# Patient Record
Sex: Male | Born: 1950 | Race: White | Hispanic: Yes | Marital: Married | Smoking: Never smoker
Health system: Southern US, Community
[De-identification: ages and names within clinical notes are randomized; demographics above are authoritative.]

## PROBLEM LIST (undated history)

## (undated) DIAGNOSIS — E785 Hyperlipidemia, unspecified: Secondary | ICD-10-CM

## (undated) DIAGNOSIS — Z800 Family history of malignant neoplasm of digestive organs: Secondary | ICD-10-CM

## (undated) DIAGNOSIS — N289 Disorder of kidney and ureter, unspecified: Secondary | ICD-10-CM

## (undated) DIAGNOSIS — Z8049 Family history of malignant neoplasm of other genital organs: Secondary | ICD-10-CM

## (undated) DIAGNOSIS — Z8619 Personal history of other infectious and parasitic diseases: Secondary | ICD-10-CM

## (undated) DIAGNOSIS — R011 Cardiac murmur, unspecified: Secondary | ICD-10-CM

## (undated) DIAGNOSIS — S0501XA Injury of conjunctiva and corneal abrasion without foreign body, right eye, initial encounter: Secondary | ICD-10-CM

## (undated) DIAGNOSIS — G4730 Sleep apnea, unspecified: Secondary | ICD-10-CM

## (undated) DIAGNOSIS — G4733 Obstructive sleep apnea (adult) (pediatric): Secondary | ICD-10-CM

## (undated) DIAGNOSIS — M1990 Unspecified osteoarthritis, unspecified site: Secondary | ICD-10-CM

## (undated) DIAGNOSIS — H269 Unspecified cataract: Secondary | ICD-10-CM

## (undated) DIAGNOSIS — Z8042 Family history of malignant neoplasm of prostate: Secondary | ICD-10-CM

## (undated) DIAGNOSIS — R7303 Prediabetes: Secondary | ICD-10-CM

## (undated) DIAGNOSIS — S0502XA Injury of conjunctiva and corneal abrasion without foreign body, left eye, initial encounter: Secondary | ICD-10-CM

## (undated) HISTORY — PX: TONSILLECTOMY: SUR1361

## (undated) HISTORY — DX: Family history of malignant neoplasm of digestive organs: Z80.0

## (undated) HISTORY — DX: Disorder of kidney and ureter, unspecified: N28.9

## (undated) HISTORY — PX: WISDOM TOOTH EXTRACTION: SHX21

## (undated) HISTORY — DX: Obstructive sleep apnea (adult) (pediatric): G47.33

## (undated) HISTORY — DX: Family history of malignant neoplasm of other genital organs: Z80.49

## (undated) HISTORY — DX: Unspecified cataract: H26.9

## (undated) HISTORY — DX: Personal history of other infectious and parasitic diseases: Z86.19

## (undated) HISTORY — DX: Family history of malignant neoplasm of prostate: Z80.42

## (undated) HISTORY — DX: Prediabetes: R73.03

## (undated) HISTORY — PX: HERNIA REPAIR: SHX51

---

## 2008-12-07 HISTORY — PX: EYE SURGERY: SHX253

## 2021-01-15 ENCOUNTER — Ambulatory Visit (INDEPENDENT_AMBULATORY_CARE_PROVIDER_SITE_OTHER): Payer: Medicare Other | Admitting: Orthopaedic Surgery

## 2021-01-15 ENCOUNTER — Ambulatory Visit: Payer: Self-pay

## 2021-01-15 VITALS — Ht 73.5 in | Wt 220.0 lb

## 2021-01-15 DIAGNOSIS — M25552 Pain in left hip: Secondary | ICD-10-CM | POA: Diagnosis not present

## 2021-01-15 DIAGNOSIS — M1612 Unilateral primary osteoarthritis, left hip: Secondary | ICD-10-CM | POA: Diagnosis not present

## 2021-01-15 NOTE — Progress Notes (Signed)
Office Visit Note   Patient: Joel Brown           Date of Birth: February 27, 1951           MRN: [id number] Visit Date: 01/15/2021              Requested by: No referring provider defined for this encounter. PCP: No primary care provider on file.   Assessment & Plan: Visit Diagnoses:  1. Pain in left hip   2. Unilateral primary osteoarthritis, left hip     Plan: I agree with the other orthopedic surgeon in town who is my colleagues that he would benefit from a hip replacement for his left hip to treat the pain from his osteoarthritis. I had a long and thorough discussion with him about his hip. I showed him a hip model and explained in detail what this type of surgery involves. We discussed the risk and benefits of surgery as well as what to expect from an intraoperative and postoperative course. He would actually like me to schedule this for him and I agree to do this. We will work on getting it scheduled in the near future. All questions and concerns were answered and addressed. We will be in touch with him. He is welcome to reach out for other questions if he has them.  Follow-Up Instructions: Return for 2 weeks post-op.   Orders:  No orders of the defined types were placed in this encounter.  No orders of the defined types were placed in this encounter.     Procedures: No procedures performed   Clinical Data: No additional findings.   Subjective: Chief Complaint  Patient presents with  . Left Hip - Pain  The patient is a very pleasant and active 70 year old gentleman who comes in for a second opinion as it relates to his left hip. He does have well-documented osteoarthritis of his left hip. It is becoming worse to the point where it is detrimentally affecting his mobility, his quality of life and his activities day living. He has a much harder time putting his shoes and socks on on the left side than the right side. This started around 2020 so has been hurting him for  getting close to 2 years now. He has been told in the past that he has chronic renal insufficiency. He otherwise has some cholesterol issues but no significant medical problems. He has moved here from [place]. He does have an upcoming appointment in early March with a new primary care physician for him. He is seeing one of my colleagues in town who appropriately recommended a hip replacement. That surgeon is accomplished surgeon and does perform anterior hip surgery. He came to me for second opinion because he has had some people recommend seeing me as well. At this point his left hip pain occurs quite significantly on a daily basis.  HPI  Review of Systems There is currently no listed headache, chest pain, shortness of breath, fever, chills, nausea, vomiting  Objective: Vital Signs: Ht 6' 1.5" (1.867 m)   Wt 220 lb (99.8 kg)   BMI 28.63 kg/m   Physical Exam He is alert and oriented x3 and in no acute distress Ortho Exam Examination of his right hip is normal with full range of motion and no pain in the groin. Examination of his left hip shows limitations with internal and external rotation and significant pain in the groin. Specialty Comments:  No specialty comments available.  Imaging: No results found. X-rays  that accompany him that are independent reviewed of his pelvis and left hip show significant end-stage arthritis of the left hip. There is complete loss of the superior lateral joint space as well as some flattening of the femoral head and essentially bone-on-bone wear. There are sclerotic changes in the femoral head and acetabulum with a small cyst in the acetabulum which is consistent with his osteoarthritis.  PMFS History: Patient Active Problem List   Diagnosis Date Noted  . Unilateral primary osteoarthritis, left hip 01/15/2021   No past medical history on file.  No family history on file.   Social History   Occupational History  . Not on file  Tobacco Use  .  Smoking status: Not on file  . Smokeless tobacco: Not on file  Substance and Sexual Activity  . Alcohol use: Not on file  . Drug use: Not on file  . Sexual activity: Not on file

## 2021-02-12 ENCOUNTER — Other Ambulatory Visit: Payer: Self-pay | Admitting: Internal Medicine

## 2021-02-12 DIAGNOSIS — Z01818 Encounter for other preprocedural examination: Secondary | ICD-10-CM

## 2021-02-12 DIAGNOSIS — E785 Hyperlipidemia, unspecified: Secondary | ICD-10-CM

## 2021-02-12 DIAGNOSIS — Z8249 Family history of ischemic heart disease and other diseases of the circulatory system: Secondary | ICD-10-CM

## 2021-02-12 DIAGNOSIS — R030 Elevated blood-pressure reading, without diagnosis of hypertension: Secondary | ICD-10-CM

## 2021-02-12 DIAGNOSIS — I10 Essential (primary) hypertension: Secondary | ICD-10-CM

## 2021-02-13 ENCOUNTER — Other Ambulatory Visit: Payer: Self-pay

## 2021-02-13 ENCOUNTER — Ambulatory Visit: Payer: Medicare Other

## 2021-02-13 DIAGNOSIS — Z8249 Family history of ischemic heart disease and other diseases of the circulatory system: Secondary | ICD-10-CM

## 2021-02-13 DIAGNOSIS — Z01818 Encounter for other preprocedural examination: Secondary | ICD-10-CM

## 2021-02-13 DIAGNOSIS — I10 Essential (primary) hypertension: Secondary | ICD-10-CM

## 2021-02-14 ENCOUNTER — Other Ambulatory Visit: Payer: Self-pay

## 2021-03-04 ENCOUNTER — Other Ambulatory Visit: Payer: Self-pay | Admitting: Physician Assistant

## 2021-03-05 NOTE — Patient Instructions (Addendum)
DUE TO COVID-19 ONLY ONE VISITOR IS ALLOWED TO COME WITH YOU AND STAY IN THE WAITING ROOM ONLY DURING PRE OP AND PROCEDURE DAY OF SURGERY. THE 1 VISITOR  MAY VISIT WITH YOU AFTER SURGERY IN YOUR PRIVATE ROOM DURING VISITING HOURS ONLY!  YOU NEED TO HAVE A COVID 19 TEST ON_4/6______ @__2 :30pm_____, THIS TEST MUST BE DONE BEFORE SURGERY,  COVID TESTING SITE [address] [place] , IT IS ON THE RIGHT GOING OUT [address] APPROXIMATELY  2 MINUTES PAST [employer] ON THE RIGHT. ONCE YOUR COVID TEST IS COMPLETED,  PLEASE BEGIN THE QUARANTINE INSTRUCTIONS AS OUTLINED IN YOUR HANDOUT.                Seraphim Trow    Your procedure is scheduled on: 03/14/21   Report to [facility] Main  Entrance   Report to admitting at  9:30 AM     Call this number if you have problems the morning of surgery [phone]    BRUSH YOUR TEETH MORNING OF SURGERY AND RINSE YOUR MOUTH OUT, NO CHEWING GUM CANDY OR MINTS.    No food after midnight.    You may have clear liquid until 9:00 AM.    At 8:30 AM drink pre surgery drink.   Nothing by mouth after 9:00  AM.    Take these medicines the morning of surgery with A SIP OF WATER: none                                You may not have any metal on your body including              piercings  Do not wear jewelry,  lotions, powders or deodorant                         Men may shave face and neck.   Do not bring valuables to the hospital. [facility] IS NOT             RESPONSIBLE   FOR VALUABLES.  Contacts, dentures or bridgework may not be worn into surgery.    _____________________________________________________________________             [facility] - Preparing for Surgery Before surgery, you can play an important role.  Because skin is not sterile, your skin needs to be as free of germs as possible.  You can reduce the number of germs on your skin by washing with CHG (chlorahexidine gluconate) soap before  surgery.  CHG is an antiseptic cleaner which kills germs and bonds with the skin to continue killing germs even after washing. Please DO NOT use if you have an allergy to CHG or antibacterial soaps.  If your skin becomes reddened/irritated stop using the CHG and inform your nurse when you arrive at Short Stay..  You may shave your face/neck.  Please follow these instructions carefully:  1.  Shower with CHG Soap the night before surgery and the  morning of Surgery.  2.  If you choose to wash your hair, wash your hair first as usual with your  normal  shampoo.  3.  After you shampoo, rinse your hair and body thoroughly to remove the  shampoo.  4.  Use CHG as you would any other liquid soap.  You can apply chg directly  to the skin and wash                       Gently with a scrungie or clean washcloth.  5.  Apply the CHG Soap to your body ONLY FROM THE NECK DOWN.   Do not use on face/ open                           Wound or open sores. Avoid contact with eyes, ears mouth and genitals (private parts).                       Wash face,  Genitals (private parts) with your normal soap.             6.  Wash thoroughly, paying special attention to the area where your surgery  will be performed.  7.  Thoroughly rinse your body with warm water from the neck down.  8.  DO NOT shower/wash with your normal soap after using and rinsing off  the CHG Soap.             9.  Pat yourself dry with a clean towel.            10.  Wear clean pajamas.            11.  Place clean sheets on your bed the night of your first shower and do not  sleep with pets. Day of Surgery : Do not apply any lotions/deodorants the morning of surgery.  Please wear clean clothes to the hospital/surgery center.  FAILURE TO FOLLOW THESE INSTRUCTIONS MAY RESULT IN THE CANCELLATION OF YOUR SURGERY PATIENT SIGNATURE_________________________________  NURSE  SIGNATURE__________________________________  ________________________________________________________________________   Adam Phenix  An incentive spirometer is a tool that can help keep your lungs clear and active. This tool measures how well you are filling your lungs with each breath. Taking long deep breaths may help reverse or decrease the chance of developing breathing (pulmonary) problems (especially infection) following:  A long period of time when you are unable to move or be active. BEFORE THE PROCEDURE   If the spirometer includes an indicator to show your best effort, your nurse or respiratory therapist will set it to a desired goal.  If possible, sit up straight or lean slightly forward. Try not to slouch.  Hold the incentive spirometer in an upright position. INSTRUCTIONS FOR USE  1. Sit on the edge of your bed if possible, or sit up as far as you can in bed or on a chair. 2. Hold the incentive spirometer in an upright position. 3. Breathe out normally. 4. Place the mouthpiece in your mouth and seal your lips tightly around it. 5. Breathe in slowly and as deeply as possible, raising the piston or the ball toward the top of the column. 6. Hold your breath for 3-5 seconds or for as long as possible. Allow the piston or ball to fall to the bottom of the column. 7. Remove the mouthpiece from your mouth and breathe out normally. 8. Rest for a few seconds and repeat Steps 1 through 7 at least 10 times every 1-2 hours when you are awake. Take your time and take a few normal breaths between deep breaths. 9. The spirometer may include an indicator to show your best effort.  Use the indicator as a goal to work toward during each repetition. 10. After each set of 10 deep breaths, practice coughing to be sure your lungs are clear. If you have an incision (the cut made at the time of surgery), support your incision when coughing by placing a pillow or rolled up towels firmly  against it. Once you are able to get out of bed, walk around indoors and cough well. You may stop using the incentive spirometer when instructed by your caregiver.  RISKS AND COMPLICATIONS  Take your time so you do not get dizzy or light-headed.  If you are in pain, you may need to take or ask for pain medication before doing incentive spirometry. It is harder to take a deep breath if you are having pain. AFTER USE  Rest and breathe slowly and easily.  It can be helpful to keep track of a log of your progress. Your caregiver can provide you with a simple table to help with this. If you are using the spirometer at home, follow these instructions: [place] IF:   You are having difficultly using the spirometer.  You have trouble using the spirometer as often as instructed.  Your pain medication is not giving enough relief while using the spirometer.  You develop fever of 100.5 F (38.1 C) or higher. SEEK IMMEDIATE MEDICAL CARE IF:   You cough up bloody sputum that had not been present before.  You develop fever of 102 F (38.9 C) or greater.  You develop worsening pain at or near the incision site. MAKE SURE YOU:   Understand these instructions.  Will watch your condition.  Will get help right away if you are not doing well or get worse. Document Released: 04/05/2007 Document Revised: 02/15/2012 Document Reviewed: 06/06/2007 [facility] Patient Information 2014 [place], [place].   ________________________________________________________________________

## 2021-03-06 ENCOUNTER — Other Ambulatory Visit: Payer: Self-pay

## 2021-03-06 ENCOUNTER — Encounter (HOSPITAL_COMMUNITY)
Admission: RE | Admit: 2021-03-06 | Discharge: 2021-03-06 | Disposition: A | Discharge status: Home / Self Care | Payer: Medicare Other | Source: Ambulatory Visit | Attending: Orthopaedic Surgery | Admitting: Orthopaedic Surgery

## 2021-03-06 ENCOUNTER — Encounter (HOSPITAL_COMMUNITY): Payer: Self-pay

## 2021-03-06 DIAGNOSIS — Z01818 Encounter for other preprocedural examination: Secondary | ICD-10-CM | POA: Diagnosis not present

## 2021-03-06 HISTORY — DX: Hyperlipidemia, unspecified: E78.5

## 2021-03-06 HISTORY — DX: Injury of conjunctiva and corneal abrasion without foreign body, right eye, initial encounter: S05.01XA

## 2021-03-06 HISTORY — DX: Unspecified osteoarthritis, unspecified site: M19.90

## 2021-03-06 HISTORY — DX: Cardiac murmur, unspecified: R01.1

## 2021-03-06 HISTORY — DX: Sleep apnea, unspecified: G47.30

## 2021-03-06 HISTORY — DX: Injury of conjunctiva and corneal abrasion without foreign body, left eye, initial encounter: S05.02XA

## 2021-03-06 LAB — CBC
HCT: 45.4 % (ref 39.0–52.0)
Hemoglobin: 15.4 g/dL (ref 13.0–17.0)
MCH: 31.4 pg (ref 26.0–34.0)
MCHC: 33.9 g/dL (ref 30.0–36.0)
MCV: 92.7 fL (ref 80.0–100.0)
Platelets: 224 10*3/uL (ref 150–400)
RBC: 4.9 MIL/uL (ref 4.22–5.81)
RDW: 13.2 % (ref 11.5–15.5)
WBC: 8.7 10*3/uL (ref 4.0–10.5)
nRBC: 0 % (ref 0.0–0.2)

## 2021-03-06 LAB — SURGICAL PCR SCREEN
MRSA, PCR: NEGATIVE
Staphylococcus aureus: POSITIVE — AB

## 2021-03-06 NOTE — Progress Notes (Signed)
COVID Vaccine Completed:yes Date COVID Vaccine completed:02/23/20-booster 10/09/20 COVID vaccine manufacturer:   Moderna     PCP - Charlane Ferretti DO Cardiologist -   Chest x-ray - no EKG - 03/06/21-chart Stress Test - no ECHO - 02/13/21-epic Cardiac Cath - no Pacemaker/ICD device last checked:NA  Sleep Study - yes CPAP - yes  Fasting Blood Sugar - NA Checks Blood Sugar _____ times a day  Blood Thinner Instructions:NA Aspirin Instructions: Last Dose:  Anesthesia review:   Patient denies shortness of breath, fever, cough and chest pain at PAT appointment yes Patient verbalized understanding of instructions that were given to them at the PAT appointment. Patient was also instructed that they will need to review over the PAT instructions again at home before surgery.yes Pt has no SOB with activities.

## 2021-03-12 ENCOUNTER — Other Ambulatory Visit (HOSPITAL_COMMUNITY): Payer: Medicare Other

## 2021-03-13 ENCOUNTER — Encounter (HOSPITAL_COMMUNITY): Payer: Self-pay | Admitting: Orthopaedic Surgery

## 2021-03-13 ENCOUNTER — Other Ambulatory Visit (HOSPITAL_COMMUNITY)
Admission: RE | Admit: 2021-03-13 | Discharge: 2021-03-13 | Disposition: A | Discharge status: Home / Self Care | Payer: Medicare Other | Source: Ambulatory Visit | Attending: Orthopaedic Surgery | Admitting: Orthopaedic Surgery

## 2021-03-13 DIAGNOSIS — Z01812 Encounter for preprocedural laboratory examination: Secondary | ICD-10-CM | POA: Diagnosis present

## 2021-03-13 DIAGNOSIS — Z20822 Contact with and (suspected) exposure to covid-19: Secondary | ICD-10-CM | POA: Diagnosis not present

## 2021-03-13 LAB — SARS CORONAVIRUS 2 (TAT 6-24 HRS): SARS Coronavirus 2: NEGATIVE

## 2021-03-13 NOTE — H&P (Signed)
TOTAL HIP ADMISSION H&P  Patient is admitted for left total hip arthroplasty.  Subjective:  Chief Complaint: left hip pain  HPI: Joel Brown, 70 y.o. male, has a history of pain and functional disability in the left hip(s) due to arthritis and patient has failed non-surgical conservative treatments for greater than 12 weeks to include NSAID's and/or analgesics and activity modification.  Onset of symptoms was gradual starting 2 years ago with gradually worsening course since that time.The patient noted no past surgery on the left hip(s).  Patient currently rates pain in the left hip at 8 out of 10 with activity. Patient has night pain, worsening of pain with activity and weight bearing, trendelenberg gait, pain that interfers with activities of daily living and pain with passive range of motion. Patient has evidence of subchondral sclerosis, periarticular osteophytes and joint space narrowing by imaging studies. This condition presents safety issues increasing the risk of falls.  There is no current active infection.  Patient Active Problem List   Diagnosis Date Noted  . Unilateral primary osteoarthritis, left hip 01/15/2021   Past Medical History:  Diagnosis Date  . Arthritis    fingers, hip  . Bilateral corneal abrasions    uses an ointment daily  . Heart murmur   . Hyperlipidemia   . Sleep apnea     Past Surgical History:  Procedure Laterality Date  . EYE SURGERY Bilateral 2010  . HERNIA REPAIR    . TONSILLECTOMY     as a child  . WISDOM TOOTH EXTRACTION      No current facility-administered medications for this encounter.   Current Outpatient Medications  Medication Sig Dispense Refill Last Dose  . atorvastatin (LIPITOR) 10 MG tablet Take 10 mg by mouth daily.     . Glucosamine HCl 1500 MG TABS Take 3,000 mg by mouth daily. Dona     . Multiple Vitamins-Minerals (MULTIVITAMIN WITH MINERALS) tablet Take 6 tablets by mouth daily. Package with a total of 6 pill   Omega  3 Co q 10/ phyto Probiotic     . sodium chloride (MURO 128) 5 % ophthalmic ointment Place 1 application into both eyes at bedtime.     . Sodium Hyaluronate, oral, (HYALURONIC ACID PO) Take 3 capsules by mouth daily. With supplement      Allergies  Allergen Reactions  . Iodine Itching    IV IODINE   . Penicillins Other (See Comments)    unknown  . Latex Rash    Social History   Tobacco Use  . Smoking status: Never Smoker  . Smokeless tobacco: Never Used  Substance Use Topics  . Alcohol use: Yes    Comment: 1/month    History reviewed. No pertinent family history.   Review of Systems  All other systems reviewed and are negative.   Objective:  Physical Exam Vitals reviewed.  Constitutional:      Appearance: Normal appearance.  HENT:     Head: Normocephalic and atraumatic.  Eyes:     Extraocular Movements: Extraocular movements intact.     Pupils: Pupils are equal, round, and reactive to light.  Cardiovascular:     Rate and Rhythm: Normal rate.     Pulses: Normal pulses.  Pulmonary:     Effort: Pulmonary effort is normal.  Abdominal:     Palpations: Abdomen is soft.  Musculoskeletal:     Cervical back: Normal range of motion.     Right hip: Tenderness and bony tenderness present. Decreased range of motion. Decreased  strength.  Neurological:     Mental Status: He is alert and oriented to person, place, and time.  Psychiatric:        Behavior: Behavior normal.     Vital signs in last 24 hours:    Labs:   Estimated body mass index is 28.63 kg/m as calculated from the following:   Height as of 03/06/21: 6\' 1"  (1.854 m).   Weight as of 03/06/21: 98.4 kg.   Imaging Review Plain radiographs demonstrate severe degenerative joint disease of the left hip(s). The bone quality appears to be good for age and reported activity level.      Assessment/Plan:  End stage arthritis, left hip(s)  The patient history, physical examination, clinical judgement of the  provider and imaging studies are consistent with end stage degenerative joint disease of the left hip(s) and total hip arthroplasty is deemed medically necessary. The treatment options including medical management, injection therapy, arthroscopy and arthroplasty were discussed at length. The risks and benefits of total hip arthroplasty were presented and reviewed. The risks due to aseptic loosening, infection, stiffness, dislocation/subluxation,  thromboembolic complications and other imponderables were discussed.  The patient acknowledged the explanation, agreed to proceed with the plan and consent was signed. Patient is being admitted for inpatient treatment for surgery, pain control, PT, OT, prophylactic antibiotics, VTE prophylaxis, progressive ambulation and ADL's and discharge planning.The patient is planning to be discharged home with home health services

## 2021-03-14 ENCOUNTER — Ambulatory Visit (HOSPITAL_COMMUNITY): Payer: Medicare Other

## 2021-03-14 ENCOUNTER — Other Ambulatory Visit: Payer: Self-pay

## 2021-03-14 ENCOUNTER — Ambulatory Visit (HOSPITAL_COMMUNITY): Payer: Medicare Other | Admitting: Anesthesiology

## 2021-03-14 ENCOUNTER — Encounter (HOSPITAL_COMMUNITY): Payer: Self-pay | Admitting: Orthopaedic Surgery

## 2021-03-14 ENCOUNTER — Encounter (HOSPITAL_COMMUNITY)
Admission: RE | Disposition: A | Discharge status: Home / Self Care | Payer: Self-pay | Source: Ambulatory Visit | Attending: Orthopaedic Surgery

## 2021-03-14 ENCOUNTER — Observation Stay (HOSPITAL_COMMUNITY)
Admission: RE | Admit: 2021-03-14 | Discharge: 2021-03-15 | Disposition: A | Discharge status: Home / Self Care | Payer: Medicare Other | Source: Ambulatory Visit | Attending: Orthopaedic Surgery | Admitting: Orthopaedic Surgery

## 2021-03-14 DIAGNOSIS — Z96642 Presence of left artificial hip joint: Secondary | ICD-10-CM

## 2021-03-14 DIAGNOSIS — Z91040 Latex allergy status: Secondary | ICD-10-CM | POA: Insufficient documentation

## 2021-03-14 DIAGNOSIS — M1612 Unilateral primary osteoarthritis, left hip: Secondary | ICD-10-CM | POA: Diagnosis present

## 2021-03-14 DIAGNOSIS — Z419 Encounter for procedure for purposes other than remedying health state, unspecified: Secondary | ICD-10-CM

## 2021-03-14 DIAGNOSIS — Z79899 Other long term (current) drug therapy: Secondary | ICD-10-CM | POA: Insufficient documentation

## 2021-03-14 DIAGNOSIS — M24852 Other specific joint derangements of left hip, not elsewhere classified: Secondary | ICD-10-CM | POA: Diagnosis not present

## 2021-03-14 HISTORY — PX: TOTAL HIP ARTHROPLASTY: SHX124

## 2021-03-14 LAB — TYPE AND SCREEN
ABO/RH(D): O POS
Antibody Screen: NEGATIVE

## 2021-03-14 LAB — ABO/RH: ABO/RH(D): O POS

## 2021-03-14 SURGERY — ARTHROPLASTY, HIP, TOTAL, ANTERIOR APPROACH
Anesthesia: Monitor Anesthesia Care | Site: Hip | Laterality: Left

## 2021-03-14 MED ORDER — CHLORHEXIDINE GLUCONATE 0.12 % MT SOLN
15.0000 mL | Freq: Once | OROMUCOSAL | Status: AC
  Administered 2021-03-14: 15 mL via OROMUCOSAL

## 2021-03-14 MED ORDER — HYDROMORPHONE HCL 1 MG/ML IJ SOLN: 0.5000 mg | INTRAMUSCULAR | Status: DC | PRN

## 2021-03-14 MED ORDER — ONDANSETRON HCL 4 MG PO TABS: 4.0000 mg | ORAL_TABLET | Freq: Four times a day (QID) | ORAL | Status: DC | PRN

## 2021-03-14 MED ORDER — METOCLOPRAMIDE HCL 5 MG PO TABS: 5.0000 mg | ORAL_TABLET | Freq: Three times a day (TID) | ORAL | Status: DC | PRN

## 2021-03-14 MED ORDER — METHOCARBAMOL 500 MG PO TABS
500.0000 mg | ORAL_TABLET | Freq: Four times a day (QID) | ORAL | Status: DC | PRN
  Administered 2021-03-15: 500 mg via ORAL
  Filled 2021-03-14: qty 1

## 2021-03-14 MED ORDER — OXYCODONE HCL 5 MG PO TABS: 5.0000 mg | ORAL_TABLET | Freq: Once | ORAL | Status: DC | PRN

## 2021-03-14 MED ORDER — METHOCARBAMOL 500 MG IVPB - SIMPLE MED
500.0000 mg | Freq: Four times a day (QID) | INTRAVENOUS | Status: DC | PRN
  Filled 2021-03-14: qty 50

## 2021-03-14 MED ORDER — ONDANSETRON HCL 4 MG/2ML IJ SOLN: 4.0000 mg | Freq: Once | INTRAMUSCULAR | Status: DC | PRN

## 2021-03-14 MED ORDER — LIDOCAINE 2% (20 MG/ML) 5 ML SYRINGE
INTRAMUSCULAR | Status: AC
  Filled 2021-03-14: qty 5

## 2021-03-14 MED ORDER — MIDAZOLAM HCL 2 MG/2ML IJ SOLN
INTRAMUSCULAR | Status: AC
  Filled 2021-03-14: qty 2

## 2021-03-14 MED ORDER — PANTOPRAZOLE SODIUM 40 MG PO TBEC
40.0000 mg | DELAYED_RELEASE_TABLET | Freq: Every day | ORAL | Status: DC
  Administered 2021-03-14 – 2021-03-15 (×2): 40 mg via ORAL
  Filled 2021-03-14 (×2): qty 1

## 2021-03-14 MED ORDER — ONDANSETRON HCL 4 MG/2ML IJ SOLN
INTRAMUSCULAR | Status: DC | PRN
  Administered 2021-03-14: 4 mg via INTRAVENOUS

## 2021-03-14 MED ORDER — METOCLOPRAMIDE HCL 5 MG/ML IJ SOLN: 5.0000 mg | Freq: Three times a day (TID) | INTRAMUSCULAR | Status: DC | PRN

## 2021-03-14 MED ORDER — MENTHOL 3 MG MT LOZG: 1.0000 | LOZENGE | OROMUCOSAL | Status: DC | PRN

## 2021-03-14 MED ORDER — PROPOFOL 1000 MG/100ML IV EMUL
INTRAVENOUS | Status: AC
  Filled 2021-03-14: qty 100

## 2021-03-14 MED ORDER — PHENYLEPHRINE HCL-NACL 10-0.9 MG/250ML-% IV SOLN
INTRAVENOUS | Status: DC | PRN
  Administered 2021-03-14: 30 ug/min via INTRAVENOUS

## 2021-03-14 MED ORDER — MUPIROCIN 2 % EX OINT
1.0000 "application " | TOPICAL_OINTMENT | Freq: Two times a day (BID) | CUTANEOUS | Status: DC
  Administered 2021-03-14: 1 via TOPICAL
  Filled 2021-03-14: qty 22

## 2021-03-14 MED ORDER — TRANEXAMIC ACID-NACL 1000-0.7 MG/100ML-% IV SOLN
1000.0000 mg | INTRAVENOUS | Status: AC
  Administered 2021-03-14: 1000 mg via INTRAVENOUS
  Filled 2021-03-14: qty 100

## 2021-03-14 MED ORDER — CLINDAMYCIN PHOSPHATE 600 MG/50ML IV SOLN
600.0000 mg | Freq: Four times a day (QID) | INTRAVENOUS | Status: AC
  Administered 2021-03-14 – 2021-03-15 (×2): 600 mg via INTRAVENOUS
  Filled 2021-03-14 (×2): qty 50

## 2021-03-14 MED ORDER — ALUM & MAG HYDROXIDE-SIMETH 200-200-20 MG/5ML PO SUSP: 30.0000 mL | ORAL | Status: DC | PRN

## 2021-03-14 MED ORDER — GLYCOPYRROLATE PF 0.2 MG/ML IJ SOSY
PREFILLED_SYRINGE | INTRAMUSCULAR | Status: AC
  Filled 2021-03-14: qty 1

## 2021-03-14 MED ORDER — FENTANYL CITRATE (PF) 100 MCG/2ML IJ SOLN: 25.0000 ug | INTRAMUSCULAR | Status: DC | PRN

## 2021-03-14 MED ORDER — SODIUM CHLORIDE 0.9 % IV SOLN: INTRAVENOUS | Status: DC

## 2021-03-14 MED ORDER — FENTANYL CITRATE (PF) 100 MCG/2ML IJ SOLN
INTRAMUSCULAR | Status: DC | PRN
  Administered 2021-03-14 (×2): 50 ug via INTRAVENOUS

## 2021-03-14 MED ORDER — OXYCODONE HCL 5 MG PO TABS
5.0000 mg | ORAL_TABLET | ORAL | Status: DC | PRN
  Administered 2021-03-14: 10 mg via ORAL
  Administered 2021-03-15: 5 mg via ORAL
  Administered 2021-03-15: 10 mg via ORAL
  Filled 2021-03-14: qty 2
  Filled 2021-03-14: qty 1

## 2021-03-14 MED ORDER — 0.9 % SODIUM CHLORIDE (POUR BTL) OPTIME
TOPICAL | Status: DC | PRN
  Administered 2021-03-14: 1000 mL

## 2021-03-14 MED ORDER — LIDOCAINE HCL (CARDIAC) PF 100 MG/5ML IV SOSY
PREFILLED_SYRINGE | INTRAVENOUS | Status: DC | PRN
  Administered 2021-03-14: 50 mg via INTRAVENOUS

## 2021-03-14 MED ORDER — DIPHENHYDRAMINE HCL 12.5 MG/5ML PO ELIX
12.5000 mg | ORAL_SOLUTION | ORAL | Status: DC | PRN
  Administered 2021-03-15: 25 mg via ORAL
  Filled 2021-03-14: qty 10

## 2021-03-14 MED ORDER — SODIUM CHLORIDE 0.9 % IR SOLN
Status: DC | PRN
  Administered 2021-03-14: 1000 mL

## 2021-03-14 MED ORDER — ONDANSETRON HCL 4 MG/2ML IJ SOLN: 4.0000 mg | Freq: Four times a day (QID) | INTRAMUSCULAR | Status: DC | PRN

## 2021-03-14 MED ORDER — DOCUSATE SODIUM 100 MG PO CAPS
100.0000 mg | ORAL_CAPSULE | Freq: Two times a day (BID) | ORAL | Status: DC
  Administered 2021-03-14 – 2021-03-15 (×2): 100 mg via ORAL
  Filled 2021-03-14 (×2): qty 1

## 2021-03-14 MED ORDER — ACETAMINOPHEN 325 MG PO TABS
325.0000 mg | ORAL_TABLET | Freq: Four times a day (QID) | ORAL | Status: DC | PRN
  Administered 2021-03-15: 650 mg via ORAL
  Filled 2021-03-14: qty 2

## 2021-03-14 MED ORDER — FENTANYL CITRATE (PF) 100 MCG/2ML IJ SOLN
INTRAMUSCULAR | Status: AC
  Filled 2021-03-14: qty 2

## 2021-03-14 MED ORDER — LACTATED RINGERS IV SOLN: INTRAVENOUS | Status: DC

## 2021-03-14 MED ORDER — PROPOFOL 500 MG/50ML IV EMUL
INTRAVENOUS | Status: DC | PRN
  Administered 2021-03-14: 50 ug/kg/min via INTRAVENOUS

## 2021-03-14 MED ORDER — ORAL CARE MOUTH RINSE: 15.0000 mL | Freq: Once | OROMUCOSAL | Status: AC

## 2021-03-14 MED ORDER — ASPIRIN 81 MG PO CHEW
81.0000 mg | CHEWABLE_TABLET | Freq: Two times a day (BID) | ORAL | Status: DC
  Administered 2021-03-14 – 2021-03-15 (×2): 81 mg via ORAL
  Filled 2021-03-14 (×2): qty 1

## 2021-03-14 MED ORDER — CLINDAMYCIN PHOSPHATE 900 MG/50ML IV SOLN
900.0000 mg | INTRAVENOUS | Status: AC
  Administered 2021-03-14: 900 mg via INTRAVENOUS
  Filled 2021-03-14: qty 50

## 2021-03-14 MED ORDER — DEXAMETHASONE SODIUM PHOSPHATE 10 MG/ML IJ SOLN
INTRAMUSCULAR | Status: AC
  Filled 2021-03-14: qty 1

## 2021-03-14 MED ORDER — ATORVASTATIN CALCIUM 10 MG PO TABS
10.0000 mg | ORAL_TABLET | Freq: Every day | ORAL | Status: DC
  Administered 2021-03-14 – 2021-03-15 (×2): 10 mg via ORAL
  Filled 2021-03-14 (×2): qty 1

## 2021-03-14 MED ORDER — OXYCODONE HCL 5 MG/5ML PO SOLN: 5.0000 mg | Freq: Once | ORAL | Status: DC | PRN

## 2021-03-14 MED ORDER — CHLORHEXIDINE GLUCONATE CLOTH 2 % EX PADS: 6.0000 | MEDICATED_PAD | Freq: Every day | CUTANEOUS | Status: DC

## 2021-03-14 MED ORDER — OXYCODONE HCL 5 MG PO TABS
10.0000 mg | ORAL_TABLET | ORAL | Status: DC | PRN
Start: 2021-03-14 — End: 2021-03-15
  Filled 2021-03-14: qty 2

## 2021-03-14 MED ORDER — DEXAMETHASONE SODIUM PHOSPHATE 10 MG/ML IJ SOLN
INTRAMUSCULAR | Status: DC | PRN
  Administered 2021-03-14: 8 mg via INTRAVENOUS

## 2021-03-14 MED ORDER — MIDAZOLAM HCL 5 MG/5ML IJ SOLN
INTRAMUSCULAR | Status: DC | PRN
  Administered 2021-03-14: 2 mg via INTRAVENOUS

## 2021-03-14 MED ORDER — PHENOL 1.4 % MT LIQD: 1.0000 | OROMUCOSAL | Status: DC | PRN

## 2021-03-14 SURGICAL SUPPLY — 43 items
BAG ZIPLOCK 12X15 (MISCELLANEOUS) IMPLANT
BALL HIP ARTICU EZE 36 8.5 (Hips) ×1 IMPLANT
BENZOIN TINCTURE PRP APPL 2/3 (GAUZE/BANDAGES/DRESSINGS) IMPLANT
BLADE SAW SGTL 18X1.27X75 (BLADE) ×2 IMPLANT
CHLORAPREP W/TINT 26 (MISCELLANEOUS) ×2 IMPLANT
COVER PERINEAL POST (MISCELLANEOUS) ×2 IMPLANT
COVER SURGICAL LIGHT HANDLE (MISCELLANEOUS) ×2 IMPLANT
COVER WAND RF STERILE (DRAPES) ×2 IMPLANT
CUP ACET PNNCL SECTR W/GRIP 56 (Hips) ×1 IMPLANT
DRAPE STERI IOBAN 125X83 (DRAPES) IMPLANT
DRAPE SURG ISO 125X83 STRL (DRAPES) ×2 IMPLANT
DRAPE U-SHAPE 47X51 STRL (DRAPES) ×4 IMPLANT
DRSG AQUACEL AG ADV 3.5X10 (GAUZE/BANDAGES/DRESSINGS) ×2 IMPLANT
DURAPREP 26ML APPLICATOR (WOUND CARE) IMPLANT
ELECT REM PT RETURN 15FT ADLT (MISCELLANEOUS) ×2 IMPLANT
GAUZE XEROFORM 1X8 LF (GAUZE/BANDAGES/DRESSINGS) ×2 IMPLANT
GLOVE SRG 8 PF TXTR STRL LF DI (GLOVE) ×2 IMPLANT
GLOVE SURG ENC MOIS LTX SZ7.5 (GLOVE) ×2 IMPLANT
GLOVE SURG LTX SZ8 (GLOVE) ×2 IMPLANT
GLOVE SURG UNDER POLY LF SZ8 (GLOVE) ×2
GOWN STRL REUS W/TWL XL LVL3 (GOWN DISPOSABLE) ×4 IMPLANT
HANDPIECE INTERPULSE COAX TIP (DISPOSABLE) ×1
HIP BALL ARTICU EZE 36 8.5 (Hips) ×2 IMPLANT
HOLDER FOLEY CATH W/STRAP (MISCELLANEOUS) ×2 IMPLANT
KIT TURNOVER KIT A (KITS) ×2 IMPLANT
LINER NEUTRAL 52MMX36MMX56N (Liner) ×2 IMPLANT
PACK ANTERIOR HIP CUSTOM (KITS) ×2 IMPLANT
PENCIL SMOKE EVACUATOR (MISCELLANEOUS) IMPLANT
PINN SECTOR W/GRIP ACE CUP 56 (Hips) ×2 IMPLANT
SET HNDPC FAN SPRY TIP SCT (DISPOSABLE) ×1 IMPLANT
STAPLER VISISTAT 35W (STAPLE) IMPLANT
STEM CORAIL KLA11 (Stem) ×2 IMPLANT
STRIP CLOSURE SKIN 1/2X4 (GAUZE/BANDAGES/DRESSINGS) IMPLANT
SUT ETHIBOND NAB CT1 #1 30IN (SUTURE) ×2 IMPLANT
SUT ETHILON 2 0 PS N (SUTURE) IMPLANT
SUT MNCRL AB 4-0 PS2 18 (SUTURE) IMPLANT
SUT VIC AB 0 CT1 36 (SUTURE) ×2 IMPLANT
SUT VIC AB 1 CT1 36 (SUTURE) ×2 IMPLANT
SUT VIC AB 2-0 CT1 27 (SUTURE) ×2
SUT VIC AB 2-0 CT1 TAPERPNT 27 (SUTURE) ×2 IMPLANT
TRAY FOL W/BAG SLVR 16FR STRL (SET/KITS/TRAYS/PACK) ×1 IMPLANT
TRAY FOLEY MTR SLVR 16FR STAT (SET/KITS/TRAYS/PACK) IMPLANT
TRAY FOLEY W/BAG SLVR 16FR LF (SET/KITS/TRAYS/PACK) ×1

## 2021-03-14 NOTE — Interval H&P Note (Signed)
History and Physical Interval Note: The patient understands is here today for a left total hip replacement to treat his left hip osteoarthritis.  There has been no acute or interval change in his medical status.  See recent H&P.  The risks and benefits of the left hip surgery have been described in detail and informed consent is obtained.  The left hip has been marked.  03/14/2021 11:26 AM  Joel Brown  has presented today for surgery, with the diagnosis of osteoarthritis left hip.  The various methods of treatment have been discussed with the patient and family. After consideration of risks, benefits and other options for treatment, the patient has consented to  Procedure(s): LEFT TOTAL HIP ARTHROPLASTY ANTERIOR APPROACH (Left) as a surgical intervention.  The patient's history has been reviewed, patient examined, no change in status, stable for surgery.  I have reviewed the patient's chart and labs.  Questions were answered to the patient's satisfaction.     Kathryne Hitch

## 2021-03-14 NOTE — Anesthesia Preprocedure Evaluation (Signed)
Anesthesia Evaluation  Patient identified by MRN, date of birth, ID band Patient awake    Reviewed: Allergy & Precautions, NPO status , Patient's Chart, lab work & pertinent test results  Airway Mallampati: II  TM Distance: >3 FB Neck ROM: Full    Dental  (+) Teeth Intact, Poor Dentition   Pulmonary    breath sounds clear to auscultation       Cardiovascular  Rhythm:Regular Rate:Normal     Neuro/Psych    GI/Hepatic   Endo/Other    Renal/GU      Musculoskeletal   Abdominal   Peds  Hematology   Anesthesia Other Findings   Reproductive/Obstetrics                             Anesthesia Physical Anesthesia Plan  ASA: III  Anesthesia Plan: MAC and Spinal   Post-op Pain Management:    Induction: Intravenous  PONV Risk Score and Plan: Ondansetron and Dexamethasone  Airway Management Planned: Natural Airway and Simple Face Mask  Additional Equipment:   Intra-op Plan:   Post-operative Plan:   Informed Consent: I have reviewed the patients History and Physical, chart, labs and discussed the procedure including the risks, benefits and alternatives for the proposed anesthesia with the patient or authorized representative who has indicated his/her understanding and acceptance.     Dental advisory given  Plan Discussed with: CRNA and Anesthesiologist  Anesthesia Plan Comments:         Anesthesia Quick Evaluation

## 2021-03-14 NOTE — Brief Op Note (Signed)
03/14/2021  2:39 PM  PATIENT:  Joel Brown  70 y.o. male  PRE-OPERATIVE DIAGNOSIS:  osteoarthritis left hip  POST-OPERATIVE DIAGNOSIS:  osteoarthritis left hip  PROCEDURE:  Procedure(s): LEFT TOTAL HIP ARTHROPLASTY ANTERIOR APPROACH (Left)  SURGEON:  Surgeon(s) and Role:    Kathryne Hitch, MD - Primary  PHYSICIAN ASSISTANT:  Rexene Edison, PA-C  ANESTHESIA:   spinal  EBL:  250 mL   COUNTS:  YES  DICTATION: .Other Dictation: Dictation Number [id number]  PLAN OF CARE: Admit for overnight observation  PATIENT DISPOSITION:  PACU - hemodynamically stable.   Delay start of Pharmacological VTE agent (>24hrs) due to surgical blood loss or risk of bleeding: no

## 2021-03-14 NOTE — Anesthesia Postprocedure Evaluation (Signed)
Anesthesia Post Note  Patient: Joel Brown  Procedure(s) Performed: LEFT TOTAL HIP ARTHROPLASTY ANTERIOR APPROACH (Left Hip)     Patient location during evaluation: PACU Anesthesia Type: MAC Level of consciousness: oriented and awake and alert Pain management: pain level controlled Vital Signs Assessment: post-procedure vital signs reviewed and stable Respiratory status: spontaneous breathing, respiratory function stable and patient connected to nasal cannula oxygen Cardiovascular status: blood pressure returned to baseline and stable Postop Assessment: no headache, no backache and no apparent nausea or vomiting Anesthetic complications: no   No complications documented.  Last Vitals:  Vitals:   03/14/21 1645 03/14/21 1724  BP: 124/69 139/87  Pulse: (!) 49 (!) 59  Resp: 10   Temp:  (!) 36.4 C  SpO2: 96% 100%    Last Pain:  Vitals:   03/14/21 1724  TempSrc: Oral  PainSc:                  , COKER

## 2021-03-14 NOTE — Anesthesia Procedure Notes (Signed)
Spinal  Patient location during procedure: OR Start time: 03/14/2021 1:19 PM End time: 03/14/2021 1:25 PM Reason for block: surgical anesthesia Staffing Performed: resident/CRNA  Resident/CRNA: Garth Bigness, CRNA Preanesthetic Checklist Completed: patient identified, IV checked, site marked, risks and benefits discussed, surgical consent, monitors and equipment checked, pre-op evaluation and timeout performed Spinal Block Patient position: sitting Prep: DuraPrep Patient monitoring: heart rate, cardiac monitor, continuous pulse ox and blood pressure Approach: midline Location: L3-4 Injection technique: single-shot Needle Needle type: Sprotte and Quincke  Needle gauge: 22 G Needle length: 9 cm Needle insertion depth: 6 cm Assessment Sensory level: T4 Events: CSF return

## 2021-03-14 NOTE — Transfer of Care (Signed)
Immediate Anesthesia Transfer of Care Note  Patient: Joel Brown  Procedure(s) Performed: LEFT TOTAL HIP ARTHROPLASTY ANTERIOR APPROACH (Left Hip)  Patient Location: PACU  Anesthesia Type:Spinal  Level of Consciousness: awake, alert , oriented and patient cooperative  Airway & Oxygen Therapy: Patient Spontanous Breathing and Patient connected to face mask oxygen  Post-op Assessment: Report given to RN and Post -op Vital signs reviewed and stable  Post vital signs: Reviewed and stable  Last Vitals:  Vitals Value Taken Time  BP 89/57 03/14/21 1456  Temp    Pulse 59 03/14/21 1500  Resp 16 03/14/21 1500  SpO2 98 % 03/14/21 1500  Vitals shown include unvalidated device data.  Last Pain:  Vitals:   03/14/21 1016  TempSrc: Oral         Complications: No complications documented.

## 2021-03-15 DIAGNOSIS — M1612 Unilateral primary osteoarthritis, left hip: Secondary | ICD-10-CM | POA: Diagnosis not present

## 2021-03-15 LAB — BASIC METABOLIC PANEL
Anion gap: 6 (ref 5–15)
BUN: 20 mg/dL (ref 8–23)
CO2: 24 mmol/L (ref 22–32)
Calcium: 8.3 mg/dL — ABNORMAL LOW (ref 8.9–10.3)
Chloride: 104 mmol/L (ref 98–111)
Creatinine, Ser: 1.3 mg/dL — ABNORMAL HIGH (ref 0.61–1.24)
GFR, Estimated: >60 mL/min (ref >60)
Glucose, Bld: 152 mg/dL — ABNORMAL HIGH (ref 70–99)
Potassium: 4.5 mmol/L (ref 3.5–5.1)
Sodium: 134 mmol/L — ABNORMAL LOW (ref 135–145)

## 2021-03-15 LAB — CBC
HCT: 40.9 % (ref 39.0–52.0)
Hemoglobin: 14.2 g/dL (ref 13.0–17.0)
MCH: 32 pg (ref 26.0–34.0)
MCHC: 34.7 g/dL (ref 30.0–36.0)
MCV: 92.1 fL (ref 80.0–100.0)
Platelets: 202 10*3/uL (ref 150–400)
RBC: 4.44 MIL/uL (ref 4.22–5.81)
RDW: 12.7 % (ref 11.5–15.5)
WBC: 12.3 10*3/uL — ABNORMAL HIGH (ref 4.0–10.5)
nRBC: 0 % (ref 0.0–0.2)

## 2021-03-15 MED ORDER — METHOCARBAMOL 500 MG PO TABS: 500.0000 mg | ORAL_TABLET | Freq: Four times a day (QID) | ORAL | 1 refills | Status: DC | PRN

## 2021-03-15 MED ORDER — ASPIRIN 81 MG PO CHEW: 81.0000 mg | CHEWABLE_TABLET | Freq: Two times a day (BID) | ORAL | 0 refills | Status: DC

## 2021-03-15 MED ORDER — OXYCODONE HCL 5 MG PO TABS: 5.0000 mg | ORAL_TABLET | ORAL | 0 refills | Status: DC | PRN

## 2021-03-15 NOTE — Op Note (Signed)
Joel Brown, Joel Brown MEDICAL RECORD NO: [id number] ACCOUNT NO: [id number] DATE OF BIRTH: June 19, 1951 FACILITY: [facility] LOCATION: [id number] PHYSICIAN: Vanita Panda. 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  PROCEDURE:  Left total hip arthroplasty through direct anterior approach.  IMPLANTS:  DePuy sector Gription acetabular component, size 56, size 36+0 neutral polyethylene liner, size 11 Corail femoral component with varus offset, size 36+8.5 metal hip ball.  SURGEON:  Doneen Poisson, M.D.  ASSISTANT:  Richardean Canal, PA-C.  ANESTHESIA:  Spinal.  ANTIBIOTICS:  2 g IV Ancef.  ESTIMATED BLOOD LOSS:  200-250 mL  COMPLICATIONS:  None.  INDICATIONS:  This is a very pleasant 70 year old gentleman with debilitating arthritis that has gotten worse for the last few years with his left hip.  At this point his left hip arthritis is detrimentally affecting his mobility, his quality of life and  his activities of daily living to the point he does wish to proceed with a total hip arthroplasty and we have recommended that as well.  We talked about the risk of acute blood loss anemia, nerve or vessel injury, fracture, infection, DVT, dislocation,  implant failure and skin and soft tissue issues.  We talked about our goals being decreased pain, improve mobility and overall improve quality of life.  DESCRIPTION OF PROCEDURE:  After informed consent was obtained, appropriate left hip was marked, he was brought to the operating room and sat up on a stretcher.  Spinal anesthesia was obtained, he was laid in supine position on a stretcher.  I assessed  his leg lengths and found to be actually equal.  Traction boots were  placed on both his feet and Foley catheter was placed as well.  He was then placed supine on the Hana fracture table with a perineal post in place and both legs in line skeletal  traction device and no traction applied.  His left operative hip was prepped and draped in DuraPrep and sterile drapes.  A timeout was called and he was identified correct patient, correct left hip.  I then made an incision just inferior and posterior to  the anterior superior iliac spine and carried this obliquely down the leg.  The tensor fascia was then divided longitudinally to proceed with direct anterior approach to the hip.  We identified and cauterized circumflex vessels and identified the hip  capsule, opened the hip capsule in L-type format finding moderate joint effusion and significant lateral periarticular osteophytes consistent with femoral acetabular impingement.  We placed curved retractors around the medial and lateral femoral neck and  made our femoral neck cut with the oscillating saw just proximal to the lesser trochanter and completed this  with an osteotome.  We placed a corkscrew guide in the femoral head and removed the femoral head in its entirety and found a wide area devoid  of cartilage as well.  I then placed a bent Hohmann over the medial acetabular rim and removed periarticular osteophytes around the acetabulum as well as acetabular labral remnants and other debris.  We then began reaming under direct visualization from  a size 43 reamer in a stepwise increments going up to a size 55 with all reamers placed under direct visualization.  Last reamer under direct fluoroscopy, so we could obtain our depth of reaming, our inclination  and anteversion.  I then placed the real  DePuy sector Gription acetabular component, size 56 and a 36+0 neutral polyethylene liner for that size acetabular component.  Attention was then turned to the femur with the leg externally rotated 120 degrees extended and adducted. We  were able to place  a Mueller retractor medially and Hohman retractor behind the greater trochanter, we released lateral joint capsule and used a box cutting osteotome to enter the femoral canal and a rongeur to lateralize, then began broaching using the carotid broaching  system from a size 8 going up to a size 11. With a size 11 in place, we trialed a high offset femoral neck, but that was too long, so we went with a standard femoral neck and a 36+15 hip ball and reduced this in the acetabulum and it was unstable and  needed more offset leg length.  We dislocated the hip, removed the trial components.  We placed the real Corail femoral component with varus offset to even more offset and that was a size 11, we went with a 36+8.5 head ball and reduced this in the  acetabulum and this gave [place] the offset and stability that we really needed.  It did lengthen him a little bit too, but we were sacrificing instability.  We then irrigated the soft tissue with normal saline solution using pulsatile lavage.  We  reapproximated the joint capsule with interrupted #1 Ethibond suture and #1 Vicryl to close the tensor fascia.  0 Vicryl was used to close the deep tissue and 2-0 Vicryl was used to close the subcutaneous tissue.  Staples were used to close the skin.  An  Aquacel dressing was applied.  He was taken off Hana table and taken to recovery room in stable condition with all final counts being correct.  No complications noted.  Of note, Rexene Edison, PA-C assisted during the entire case and assistance was crucial  for facilitating every aspect of this case.   SUJ D: 03/14/2021 2:37:56 pm T: 03/15/2021 4:26:00 am  JOB: [id number]/ [id number]

## 2021-03-15 NOTE — TOC Initial Note (Signed)
Transition of Care [facility]) - Initial/Assessment Note    Patient Details  Name: Joel Brown MRN: [id number] Date of Birth: July 03, 1951  [facility]) CM/SW Contact:    Armanda Heritage, RN Phone Number: 03/15/2021, 10:19 AM  Clinical Narrative:                 CM spoke with patient, [facility] to provide home health PT.  Patient reports has rolling walker, [employer] to provide 3in1.  Expected Discharge Plan: Home w Home Health Services Barriers to Discharge: No Barriers Identified   Patient Goals and CMS Choice Patient states their goals for this hospitalization and ongoing recovery are:: to go home with therapy CMS Medicare.gov Compare Post Acute Care list provided to:: Patient Choice offered to / list presented to : Patient  Expected Discharge Plan and Services Expected Discharge Plan: Home w Home Health Services   Discharge Planning Services: CM Consult Post Acute Care Choice: Home Health Living arrangements for the past 2 months: Single Family Home                 DME Arranged: 3-N-1 DME Agency: Other - Comment Loyal Buba) Date DME Agency Contacted: 03/15/21 Time DME Agency Contacted: 1018 Representative spoke with at DME Agency: Vaughan Basta HH Arranged: PT HH Agency: [facility] Date HH Agency Contacted: 03/15/21 Time HH Agency Contacted: 1019 Representative spoke with at [facility] Agency: Vaughan Basta  Prior Living Arrangements/Services Living arrangements for the past 2 months: Single Family Home Lives with:: Spouse Patient language and need for interpreter reviewed:: Yes Do you feel safe going back to the place where you live?: Yes      Need for Family Participation in Patient Care: Yes (Comment) Care giver support system in place?: Yes (comment)   Criminal Activity/Legal Involvement Pertinent to Current Situation/Hospitalization: No - Comment as needed  Activities of Daily Living Home Assistive Devices/Equipment: CPAP,Walker  (specify type),Cane (specify quad or straight),Raised toilet seat with rails,Built-in shower seat ADL Screening (condition at time of admission) Patient's cognitive ability adequate to safely complete daily activities?: Yes Is the patient deaf or have difficulty hearing?: No Does the patient have difficulty seeing, even when wearing glasses/contacts?: No Does the patient have difficulty concentrating, remembering, or making decisions?: No Patient able to express need for assistance with ADLs?: Yes Does the patient have difficulty dressing or bathing?: No Independently performs ADLs?: Yes (appropriate for developmental age) Does the patient have difficulty walking or climbing stairs?: No Weakness of Legs: None Weakness of Arms/Hands: None  Permission Sought/Granted                  Emotional Assessment Appearance:: Appears stated age Attitude/Demeanor/Rapport: Engaged Affect (typically observed): Accepting Orientation: : Oriented to Self,Oriented to Place,Oriented to  Time,Oriented to Situation   Psych Involvement: No (comment)  Admission diagnosis:  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 Status post total replacement of left hip 03/14/2021  . Unilateral primary osteoarthritis, left hip 01/15/2021   PCP:  Pa, [facility] Pharmacy:   [phone] - [place], [place] - [address] AT [address] OF [facility] [address] [place] [address] Phone: [phone] Fax: [phone]     Social Determinants of Health (SDOH) Interventions    Readmission Risk Interventions No flowsheet data found.

## 2021-03-15 NOTE — Discharge Instructions (Signed)

## 2021-03-15 NOTE — Progress Notes (Signed)
Subjective: 1 Day Post-Op Procedure(s) (LRB): LEFT TOTAL HIP ARTHROPLASTY ANTERIOR APPROACH (Left) Patient reports pain as moderate.    Objective: Vital signs in last 24 hours: Temp:  [97.4 F (36.3 C)-98.4 F (36.9 C)] 98.3 F (36.8 C) (04/09 0957) Pulse Rate:  [49-77] 72 (04/09 0957) Resp:  [10-18] 14 (04/09 0957) BP: (89-139)/(57-87) 116/69 (04/09 0957) SpO2:  [95 %-100 %] 95 % (04/09 0957) Weight:  [99.3 kg] 99.3 kg (04/08 1724)  Intake/Output from previous day: 04/08 0701 - 04/09 0700 In: 3910.7 [P.O.:1180; I.V.:2530.7; IV Piggyback:200] Out: 3050 [Urine:2800; Blood:250] Intake/Output this shift: Total I/O In: 600 [P.O.:600] Out: 525 [Urine:525]  Recent Labs    03/15/21 0256  HGB 14.2   Recent Labs    03/15/21 0256  WBC 12.3*  RBC 4.44  HCT 40.9  PLT 202   Recent Labs    03/15/21 0256  NA 134*  K 4.5  CL 104  CO2 24  BUN 20  CREATININE 1.30*  GLUCOSE 152*  CALCIUM 8.3*   No results for input(s): LABPT, INR in the last 72 hours.  Sensation intact distally Intact pulses distally Dorsiflexion/Plantar flexion intact Incision: dressing C/D/I   Assessment/Plan: 1 Day Post-Op Procedure(s) (LRB): LEFT TOTAL HIP ARTHROPLASTY ANTERIOR APPROACH (Left) Up with therapy Discharge home with home health      Kathryne Hitch 03/15/2021, 1:33 PM

## 2021-03-15 NOTE — Discharge Summary (Signed)
Patient ID: Joel Brown MRN: [id number] DOB/AGE: Aug 10, 1951 70 y.o.  Admit date: 03/14/2021 Discharge date: 03/15/2021  Admission Diagnoses:  Principal Problem:   Unilateral primary osteoarthritis, left hip Active Problems:   Status post total replacement of left hip   Discharge Diagnoses:  Same  Past Medical History:  Diagnosis Date  . Arthritis    fingers, hip  . Bilateral corneal abrasions    uses an ointment daily  . Heart murmur   . Hyperlipidemia   . Sleep apnea     Surgeries: Procedure(s): LEFT TOTAL HIP ARTHROPLASTY ANTERIOR APPROACH on 03/14/2021   Consultants:   Discharged Condition: Improved  Hospital Course: Joel Brown is an 70 y.o. male who was admitted 03/14/2021 for operative treatment ofUnilateral primary osteoarthritis, left hip. Patient has severe unremitting pain that affects sleep, daily activities, and work/hobbies. After pre-op clearance the patient was taken to the operating room on 03/14/2021 and underwent  Procedure(s): LEFT TOTAL HIP ARTHROPLASTY ANTERIOR APPROACH.    Patient was given perioperative antibiotics:  Anti-infectives (From admission, onward)   Start     Dose/Rate Route Frequency Ordered Stop   03/14/21 1900  clindamycin (CLEOCIN) IVPB 600 mg        600 mg 100 mL/hr over 30 Minutes Intravenous Every 6 hours 03/14/21 1712 03/15/21 0041   03/14/21 1030  clindamycin (CLEOCIN) IVPB 900 mg        900 mg 100 mL/hr over 30 Minutes Intravenous On call to O.R. 03/14/21 1026 03/14/21 1322       Patient was given sequential compression devices, early ambulation, and chemoprophylaxis to prevent DVT.  Patient benefited maximally from hospital stay and there were no complications.    Recent vital signs:  Patient Vitals for the past 24 hrs:  BP Temp Temp src Pulse Resp SpO2 Height Weight  03/15/21 0957 116/69 98.3 F (36.8 C) Oral 72 14 95 % -- --  03/15/21 0534 121/70 98.3 F (36.8 C) -- 77 16 95 % -- --  03/15/21 0013 128/77 98.4 F  (36.9 C) -- 72 16 95 % -- --  03/14/21 2133 115/80 98.2 F (36.8 C) -- 70 16 95 % -- --  03/14/21 1952 118/75 98.1 F (36.7 C) -- 75 16 95 % -- --  03/14/21 1724 139/87 (!) 97.5 F (36.4 C) Oral (!) 59 -- 100 % 6\' 1"  (1.854 m) 99.3 kg  03/14/21 1645 124/69 -- -- (!) 49 10 96 % -- --  03/14/21 1630 114/68 -- -- (!) 55 10 98 % -- --  03/14/21 1615 119/67 -- -- (!) 51 10 97 % -- --  03/14/21 1600 108/67 (!) 97.4 F (36.3 C) -- (!) 58 14 96 % -- --  03/14/21 1545 94/62 -- -- (!) 54 11 96 % -- --  03/14/21 1530 95/65 -- -- (!) 54 12 96 % -- --  03/14/21 1515 98/60 -- -- (!) 56 12 98 % -- --  03/14/21 1500 93/64 -- -- 67 18 99 % -- --  03/14/21 1457 (!) 89/57 (!) 97.5 F (36.4 C) -- -- 14 100 % -- --     Recent laboratory studies:  Recent Labs    03/15/21 0256  WBC 12.3*  HGB 14.2  HCT 40.9  PLT 202  NA 134*  K 4.5  CL 104  CO2 24  BUN 20  CREATININE 1.30*  GLUCOSE 152*  CALCIUM 8.3*     Discharge Medications:   Allergies as of 03/15/2021  Reactions   Iodine Itching   IV IODINE    Penicillins Other (See Comments)   unknown   Latex Rash      Medication List    TAKE these medications   aspirin 81 MG chewable tablet Chew 1 tablet (81 mg total) by mouth 2 (two) times daily.   atorvastatin 10 MG tablet Commonly known as: LIPITOR Take 10 mg by mouth daily.   Glucosamine HCl 1500 MG Tabs Take 3,000 mg by mouth daily. Dona   HYALURONIC ACID PO Take 3 capsules by mouth daily. With supplement   methocarbamol 500 MG tablet Commonly known as: ROBAXIN Take 1 tablet (500 mg total) by mouth every 6 (six) hours as needed for muscle spasms.   multivitamin with minerals tablet Take 6 tablets by mouth daily. Package with a total of 6 pill   Omega 3 Co q 10/ phyto Probiotic   oxyCODONE 5 MG immediate release tablet Commonly known as: Oxy IR/ROXICODONE Take 1-2 tablets (5-10 mg total) by mouth every 4 (four) hours as needed for moderate pain (pain score 4-6).    sodium chloride 5 % ophthalmic ointment Commonly known as: MURO 128 Place 1 application into both eyes at bedtime.            Durable Medical Equipment  (From admission, onward)         Start     Ordered   03/14/21 1713  DME 3 n 1  Once        03/14/21 1712   03/14/21 1713  DME Walker rolling  Once       Question Answer Comment  Walker: With 5 Inch Wheels   Patient needs a walker to treat with the following condition Status post total replacement of left hip      03/14/21 1712          Diagnostic Studies: DG Pelvis Portable  Result Date: 03/14/2021 CLINICAL DATA:  Status post left hip arthroplasty. EXAM: PORTABLE PELVIS 1-2 VIEWS COMPARISON:  None. FINDINGS: Left hip arthroplasty in expected alignment. There is no periprosthetic lucency or fracture. Recent postsurgical change includes air and edema in the joint space and soft tissues and lateral skin staples. IMPRESSION: Left hip arthroplasty without immediate postoperative complication. Electronically Signed   By: Narda Rutherford M.D.   On: 03/14/2021 16:05   DG C-Arm 1-60 Min-No Report  Result Date: 03/14/2021 Fluoroscopy was utilized by the requesting physician.  No radiographic interpretation.   DG HIP OPERATIVE UNILAT W OR W/O PELVIS LEFT  Result Date: 03/14/2021 CLINICAL DATA:  Left hip replacement. EXAM: OPERATIVE LEFT HIP (WITH PELVIS IF PERFORMED) TECHNIQUE: Fluoroscopic spot image(s) were submitted for interpretation post-operatively. COMPARISON:  None. FINDINGS: Seven fluoroscopic spot views of the pelvis and left hip obtained during left hip arthroplasty. Arthroplasty in expected alignment. Total fluoroscopy time 25 seconds. Total dose 3.27 mGy. IMPRESSION: Procedural fluoroscopy during left hip arthroplasty. Electronically Signed   By: Narda Rutherford M.D.   On: 03/14/2021 15:12    Disposition: Discharge disposition: 01-Home or Self Care          Follow-up Information    Kathryne Hitch, MD  Follow up in 2 week(s).   Specialty: Orthopedic Surgery Contact information: [address] [place] [address] [phone]        [facility] Follow up.   Specialty: Home Health Services Why: agency will provide home health therapy Contact information: [address] [place] [address] [phone]  Signed: Kathryne Hitch 03/15/2021, 1:35 PM

## 2021-03-15 NOTE — Plan of Care (Signed)
  Problem: Education: Goal: Knowledge of General Education information will improve Description: Including pain rating scale, medication(s)/side effects and non-pharmacologic comfort measures Outcome: Progressing   Problem: Activity: Goal: Risk for activity intolerance will decrease Outcome: Progressing   Problem: Nutrition: Goal: Adequate nutrition will be maintained Outcome: Progressing   

## 2021-03-15 NOTE — Progress Notes (Signed)
Physical Therapy Treatment Patient Details Name: Joel Brown MRN: [id number] DOB: 1951-11-05 Today's Date: 03/15/2021    History of Present Illness Pt s/p L THR    PT Comments    Pt continues motivated and progressing well with mobility.  Spouse present for family ed including ambulating limited distance in hall, bed mobility, negotiating stairs, lower body dressing and car transfers.  Pt eager for dc home.   Follow Up Recommendations  Follow surgeon's recommendation for DC plan and follow-up therapies     Equipment Recommendations  3in1 (PT)    Recommendations for Other Services       Precautions / Restrictions Precautions Precautions: Fall Restrictions Weight Bearing Restrictions: No Other Position/Activity Restrictions: WBAT    Mobility  Bed Mobility Overal bed mobility: Needs Assistance Bed Mobility: Supine to Sit;Sit to Supine     Supine to sit: Min guard Sit to supine: Supervision   General bed mobility comments: increased time with cues for sequence and use of gait belt and R LE to self assist    Transfers Overall transfer level: Needs assistance Equipment used: Rolling walker (2 wheeled) Transfers: Sit to/from Stand Sit to Stand: Min guard;Supervision         General transfer comment: cues for LE management and use of UEs to self assist  Ambulation/Gait Ambulation/Gait assistance: Min guard;Supervision Gait Distance (Feet): 60 Feet Assistive device: Rolling walker (2 wheeled) Gait Pattern/deviations: Step-to pattern;Step-through pattern;Decreased step length - right;Decreased step length - left;Shuffle;Trunk flexed Gait velocity: decr   General Gait Details: min cues for sequence, posture and position from RW   Stairs Stairs: Yes Stairs assistance: Min assist Stair Management: No rails;Step to pattern;Backwards;With walker Number of Stairs: 4 General stair comments: 2 stairs twice bkwd with cues for sequence and foot/RW placement.  Spouse  assisting on second attempt and written instruction provided   Wheelchair Mobility    Modified Rankin (Stroke Patients Only)       Balance Overall balance assessment: Needs assistance Sitting-balance support: No upper extremity supported;Feet supported Sitting balance-Leahy Scale: Good     Standing balance support: No upper extremity supported Standing balance-Leahy Scale: Fair                              Cognition Arousal/Alertness: Awake/alert Behavior During Therapy: WFL for tasks assessed/performed Overall Cognitive Status: Within Functional Limits for tasks assessed                                        Exercises Total Joint Exercises Ankle Circles/Pumps: AROM;Both;15 reps;Supine Quad Sets: AROM;Both;10 reps;Supine Heel Slides: AAROM;Left;20 reps;Supine Hip ABduction/ADduction: AAROM;Left;15 reps;Supine    General Comments        Pertinent Vitals/Pain Pain Assessment: 0-10 Pain Score: 4  Pain Location: L hip and thigh Pain Descriptors / Indicators: Aching;Burning Pain Intervention(s): Limited activity within patient's tolerance;Monitored during session;Premedicated before session    Home Living Family/patient expects to be discharged to:: Private residence Living Arrangements: Spouse/significant other Available Help at Discharge: Family Type of Home: House Home Access: Stairs to enter Entrance Stairs-Rails: None Home Layout: One level Home Equipment: Environmental consultant - 2 wheels      Prior Function Level of Independence: Independent          PT Goals (current goals can now be found in the care plan section) Acute Rehab PT Goals Patient Stated Goal:  Get back to dancing PT Goal Formulation: With patient Time For Goal Achievement: 03/22/21 Potential to Achieve Goals: Good Progress towards PT goals: Progressing toward goals    Frequency    7X/week      PT Plan Current plan remains appropriate    Co-evaluation               AM-PAC PT "6 Clicks" Mobility   Outcome Measure  Help needed turning from your back to your side while in a flat bed without using bedrails?: A Little Help needed moving from lying on your back to sitting on the side of a flat bed without using bedrails?: A Little Help needed moving to and from a bed to a chair (including a wheelchair)?: A Little Help needed standing up from a chair using your arms (e.g., wheelchair or bedside chair)?: A Little Help needed to walk in hospital room?: A Little Help needed climbing 3-5 steps with a railing? : A Little 6 Click Score: 18    End of Session Equipment Utilized During Treatment: Gait belt Activity Tolerance: Patient tolerated treatment well Patient left: in bed;with call bell/phone within reach;with family/visitor present Nurse Communication: Mobility status PT Visit Diagnosis: Difficulty in walking, not elsewhere classified (R26.2)     Time: 9798-9211 PT Time Calculation (min) (ACUTE ONLY): 33 min  Charges:  $Gait Training: 8-22 mins $Therapeutic Exercise: 8-22 mins $Therapeutic Activity: 8-22 mins                     Mauro Kaufmann PT Acute Rehabilitation Services Pager [phone] Office [phone]    , 03/15/2021, 4:29 PM

## 2021-03-15 NOTE — Progress Notes (Signed)
Patient and family verbalized understanding of instructions. States they have a walker at home already, pt left facility with 3n1 commode.

## 2021-03-15 NOTE — Plan of Care (Signed)
Patient dc'd, care plans met.

## 2021-03-15 NOTE — Evaluation (Signed)
Physical Therapy Evaluation Patient Details Name: Joel Brown MRN: [id number] DOB: Aug 30, 1951 Today's Date: 03/15/2021   History of Present Illness  Pt s/p L THR  Clinical Impression  Pt s/p L THR and presents with functional mobility limitations 2* decreased L LE strength/ROM and post op pain.  Pt should progress to dc home with family assist.    Follow Up Recommendations Follow surgeon's recommendation for DC plan and follow-up therapies    Equipment Recommendations  3in1 (PT)    Recommendations for Other Services       Precautions / Restrictions Precautions Precautions: Fall Restrictions Weight Bearing Restrictions: No Other Position/Activity Restrictions: WBAT      Mobility  Bed Mobility               General bed mobility comments: Pt up in chair and requests back to same    Transfers Overall transfer level: Needs assistance Equipment used: Rolling walker (2 wheeled) Transfers: Sit to/from Stand Sit to Stand: Min assist         General transfer comment: cues for LE management and use of UEs to self assist  Ambulation/Gait Ambulation/Gait assistance: Min assist;Min guard Gait Distance (Feet): 150 Feet Assistive device: Rolling walker (2 wheeled) Gait Pattern/deviations: Step-to pattern;Step-through pattern;Decreased step length - right;Decreased step length - left;Shuffle;Trunk flexed Gait velocity: decr   General Gait Details: cues for sequence, posture and position from [employer]            Wheelchair Mobility    Modified Rankin (Stroke Patients Only)       Balance Overall balance assessment: Needs assistance Sitting-balance support: No upper extremity supported;Feet supported Sitting balance-Leahy Scale: Good     Standing balance support: No upper extremity supported Standing balance-Leahy Scale: Fair                               Pertinent Vitals/Pain Pain Assessment: 0-10 Pain Score: 4  Pain Location: L hip  and thigh Pain Descriptors / Indicators: Aching;Burning Pain Intervention(s): Limited activity within patient's tolerance;Monitored during session;Premedicated before session;Ice applied    Home Living Family/patient expects to be discharged to:: Private residence Living Arrangements: Spouse/significant other Available Help at Discharge: Family Type of Home: House Home Access: Stairs to enter Entrance Stairs-Rails: None Secretary/administrator of Steps: 3 Home Layout: One level Home Equipment: Environmental consultant - 2 wheels      Prior Function Level of Independence: Independent               Hand Dominance        Extremity/Trunk Assessment   Upper Extremity Assessment Upper Extremity Assessment: Overall WFL for tasks assessed    Lower Extremity Assessment Lower Extremity Assessment: LLE deficits/detail LLE Deficits / Details: AAROM at hip to 80 flex and 15 abd    Cervical / Trunk Assessment Cervical / Trunk Assessment: Normal  Communication   Communication: No difficulties  Cognition Arousal/Alertness: Awake/alert Behavior During Therapy: WFL for tasks assessed/performed Overall Cognitive Status: Within Functional Limits for tasks assessed                                        General Comments      Exercises Total Joint Exercises Ankle Circles/Pumps: AROM;Both;15 reps;Supine Quad Sets: AROM;Both;10 reps;Supine Heel Slides: AAROM;Left;20 reps;Supine Hip ABduction/ADduction: AAROM;Left;15 reps;Supine   Assessment/Plan    PT Assessment Patient  needs continued PT services  PT Problem List Decreased strength;Decreased range of motion;Decreased activity tolerance;Decreased mobility;Decreased knowledge of use of DME;Pain       PT Treatment Interventions DME instruction;Gait training;Stair training;Functional mobility training;Therapeutic activities;Therapeutic exercise;Patient/family education    PT Goals (Current goals can be found in the Care Plan  section)  Acute Rehab PT Goals Patient Stated Goal: Get back to dancing PT Goal Formulation: With patient Time For Goal Achievement: 03/22/21 Potential to Achieve Goals: Good    Frequency 7X/week   Barriers to discharge        Co-evaluation               AM-PAC PT "6 Clicks" Mobility  Outcome Measure Help needed turning from your back to your side while in a flat bed without using bedrails?: A Little Help needed moving from lying on your back to sitting on the side of a flat bed without using bedrails?: A Little Help needed moving to and from a bed to a chair (including a wheelchair)?: A Little Help needed standing up from a chair using your arms (e.g., wheelchair or bedside chair)?: A Little Help needed to walk in hospital room?: A Little Help needed climbing 3-5 steps with a railing? : A Little 6 Click Score: 18    End of Session Equipment Utilized During Treatment: Gait belt Activity Tolerance: Patient tolerated treatment well Patient left: in chair;with call bell/phone within reach;with chair alarm set Nurse Communication: Mobility status PT Visit Diagnosis: Difficulty in walking, not elsewhere classified (R26.2)    Time: 4656-8127 PT Time Calculation (min) (ACUTE ONLY): 41 min   Charges:   PT Evaluation $PT Eval Low Complexity: 1 Low PT Treatments $Gait Training: 8-22 mins $Therapeutic Exercise: 8-22 mins        Mauro Kaufmann PT Acute Rehabilitation Services Pager [phone] Office [phone]   , 03/15/2021, 4:16 PM

## 2021-03-18 ENCOUNTER — Encounter (HOSPITAL_COMMUNITY): Payer: Self-pay | Admitting: Orthopaedic Surgery

## 2021-03-27 ENCOUNTER — Other Ambulatory Visit: Payer: Self-pay

## 2021-03-27 ENCOUNTER — Encounter: Payer: Self-pay | Admitting: Orthopaedic Surgery

## 2021-03-27 ENCOUNTER — Ambulatory Visit (INDEPENDENT_AMBULATORY_CARE_PROVIDER_SITE_OTHER): Payer: Medicare Other | Admitting: Orthopaedic Surgery

## 2021-03-27 DIAGNOSIS — Z96642 Presence of left artificial hip joint: Secondary | ICD-10-CM

## 2021-03-27 NOTE — Progress Notes (Signed)
The patient will be 2 weeks tomorrow status post a left total hip arthroplasty.  He has been taking a baby aspirin twice a day and compliant with therapy.  He would like to transition to some outpatient physical therapy given the fact that he and his wife are going to a trip in [place] on June 2.  I think this is reasonable but I want therapy just to mainly work on his balance and coordination and walking on even surfaces like he may see in [place].  They do not really have to do a whole lot of strengthening.  His incision looks good over his left hip and staples were removed and Steri-Strips applied.  There is some moderate swelling there to be expected.  July things look equal.  He will go down to a baby aspirin once a day for a week and then can stop his baby aspirin.  He will continue increase his activities as comfort allows.  I will see him back in 4 weeks to see how he is doing overall.  All questions and concerns were answered and addressed.

## 2021-03-31 ENCOUNTER — Other Ambulatory Visit: Payer: Self-pay

## 2021-03-31 ENCOUNTER — Encounter: Payer: Self-pay | Admitting: Physical Therapy

## 2021-03-31 ENCOUNTER — Ambulatory Visit (INDEPENDENT_AMBULATORY_CARE_PROVIDER_SITE_OTHER): Payer: Medicare Other | Admitting: Physical Therapy

## 2021-03-31 DIAGNOSIS — R262 Difficulty in walking, not elsewhere classified: Secondary | ICD-10-CM

## 2021-03-31 DIAGNOSIS — M6281 Muscle weakness (generalized): Secondary | ICD-10-CM | POA: Diagnosis not present

## 2021-03-31 DIAGNOSIS — M25552 Pain in left hip: Secondary | ICD-10-CM | POA: Diagnosis not present

## 2021-03-31 NOTE — Patient Instructions (Signed)
Access Code: [email or web address] URL: [email or web address] Date: 03/31/2021 Prepared by: Narda Amber  Exercises Supine March - 3 x daily - 7 x weekly - 2 sets - 10 reps Seated Long Arc Quad - 3 x daily - 7 x weekly - 2 sets - 10 reps Supine Bridge - 3 x daily - 7 x weekly - 2 sets - 10 reps - 5 seconds hold Seated Hip Adduction Isometrics with Ball - 3 x daily - 7 x weekly - 2 sets - 10 reps - 5 seconds hold Hooklying Clamshell with Resistance - 2 x daily - 7 x weekly - 2 sets - 10 reps - 5 seconds hold Standing Hip Extension with Counter Support - 2 x daily - 7 x weekly - 2 sets - 10 reps Standing Hip Abduction - 2 x daily - 7 x weekly - 2 sets - 10 reps Heel rises with counter support - 2 x daily - 7 x weekly - 2 sets - 10 reps

## 2021-03-31 NOTE — Therapy (Signed)
[facility] Physical Therapy [address] [place], [place], [address] Phone: [phone]   Fax:  [phone]  Physical Therapy Evaluation  Patient Details  Name: Joel Brown MRN: [id number] Date of Birth: [birth year] Referring Provider (PT): Doneen Poisson, MD   Encounter Date: 03/31/2021   PT End of Session - 03/31/21 1533    Visit Number 1    Number of Visits 15    Date for PT Re-Evaluation 05/16/21    Progress Note Due on Visit 10    PT Start Time 1345    PT Stop Time 1430    PT Time Calculation (min) 45 min    Activity Tolerance Patient tolerated treatment well    Behavior During Therapy [facility] for tasks assessed/performed           Past Medical History:  Diagnosis Date  . Arthritis    fingers, hip  . Bilateral corneal abrasions    uses an ointment daily  . Heart murmur   . Hyperlipidemia   . Sleep apnea     Past Surgical History:  Procedure Laterality Date  . EYE SURGERY Bilateral 2010  . HERNIA REPAIR    . TONSILLECTOMY     as a child  . TOTAL HIP ARTHROPLASTY Left 03/14/2021   Procedure: LEFT TOTAL HIP ARTHROPLASTY ANTERIOR APPROACH;  Surgeon: Kathryne Hitch, MD;  Location: [facility];  Service: Orthopedics;  Laterality: Left;  . WISDOM TOOTH EXTRACTION      There were no vitals filed for this visit.    Subjective Assessment - 03/31/21 1353    Subjective Pt arriving to therapy s/p left THA on 03/14/2021. Pt amb with straight cane. Pt reporting 1/10 pain in left hip. Pt states he is leaving on May 08, 2021 to go to [place] for a 18 days. Pt wants to be ready to go and enjoy himself. Pt reporting intermittent soreness when getting up in the morning.    Pertinent History left THA on 03/14/2021    How long can you sit comfortably? 90 minutes    How long can you walk comfortably? walking around the house without a problem    Diagnostic tests X-ray following surgery    Patient Stated Goals go to [place] in June and be able to walk     Currently in Pain? No/denies              [facility] PT Assessment - 03/31/21 0001      Assessment   Medical Diagnosis Z96.642, left THA    Referring Provider (PT) Doneen Poisson, MD    Hand Dominance Right    Prior Therapy no      Precautions   Precautions None      Restrictions   Weight Bearing Restrictions No      Balance Screen   Has the patient fallen in the past 6 months No    Is the patient reluctant to leave their home because of a fear of falling?  No      Home Environment   Living Environment Private residence    Living Arrangements Spouse/significant other    Type of Home House    Home Access Stairs to enter    Entrance Stairs-Number of Steps 3   in garage and uses door frame   Entrance Stairs-Rails None      Prior Function   Level of Independence Independent    Vocation Full time employment    Vocation Requirements sititng at draw board  Cognition   Overall Cognitive Status Within Functional Limits for tasks assessed      Observation/Other Assessments   Focus on Therapeutic Outcomes (FOTO)  45 (predicted 63)      ROM / Strength   AROM / PROM / Strength Strength;AROM      AROM   Overall AROM  Deficits    Overall AROM Comments all measurements in supine    AROM Assessment Site Hip    Right/Left Hip Left    Left Hip Flexion 90    Left Hip ABduction 20    Left Hip ADduction 10      Strength   Overall Strength Deficits    Strength Assessment Site Knee;Hip    Right/Left Hip Right;Left    Right Hip Flexion 5/5    Right Hip Extension 5/5    Right Hip ABduction 5/5    Right Hip ADduction 5/5    Left Hip Flexion 3/5    Left Hip Extension 3/5    Left Hip ABduction 3/5    Left Hip ADduction 3/5    Right/Left Knee Right;Left    Right Knee Flexion 5/5    Right Knee Extension 5/5    Left Knee Flexion 4/5    Left Knee Extension 4/5      Palpation   Palpation comment TTP, lateral hip joint      Transfers   Five time sit to stand  comments  14 seconds with UE support      Ambulation/Gait   Assistive device Straight cane    Gait Pattern Decreased stance time - left;Decreased step length - left;Decreased step length - right;Decreased hip/knee flexion - left;Left circumduction;Antalgic                      Objective measurements completed on examination: See above findings.               PT Education - 03/31/21 1532    Education Details PT POC, HEP    Person(s) Educated Patient    Methods Demonstration;Explanation;Handout;Tactile cues;Verbal cues    Comprehension Verbalized understanding;Returned demonstration            PT Short Term Goals - 03/31/21 1535      PT SHORT TERM GOAL #1   Title Pt will be independent in his initial HEP.    Time 2    Period Weeks    Status New    Target Date 04/18/21      PT SHORT TERM GOAL #2   Title pt will be able to perform SLR on the L LE x 10 with no extensor lag.    Baseline unable to perform SLR on L LE currently    Time 2    Period Weeks    Status New    Target Date 04/18/21             PT Long Term Goals - 03/31/21 1536      PT LONG TERM GOAL #1   Title pt will be independent in an advance HEP.    Time 6    Period Weeks    Status New    Target Date 05/16/21      PT LONG TERM GOAL #2   Title Pt will be able to perform 5 time  sit to stand with no UE support with no pain in </= 10 seconds    Baseline 14 seconds bilateral UE support and mild soreness reported.    Time 6  Period Weeks    Status New    Target Date 05/16/21      PT LONG TERM GOAL #3   Title Pt will be able to amb with no device for >/= 1000 feet on community surfaces.    Baseline amb household distances with straight cane    Time 6    Period Weeks    Status New    Target Date 05/16/21      PT LONG TERM GOAL #4   Title Pt will be able to climb a flight of stairs with single hand rail.    Time 6    Period Weeks    Status New    Target Date 05/16/21                   Plan - 03/31/21 1544    Clinical Impression Statement Pt arriving to therapy for PT evaluation following left THA on 03/14/2021. Pt presenting with weakness noted in left hip (MMT 3/5) and knee (MMT 4/5). Pt with limited hip AROM with pain reported. Pt amb with straight cane with antalgic gait with decreased stance on left and decreased hip and knee flexion. 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 Pt is also scheduled for a trip to [place] in 6 weeks and would like to be able to navigate safely with no device. Skilled PT needed to address pt's impairments with the below interventions.    Personal Factors and Comorbidities Comorbidity 1    Comorbidities heart murmur, arthritis    Examination-Activity Limitations Squat;Stairs;Stand;Transfers;Lift    Examination-Participation Restrictions Community Activity;Other;Occupation    Stability/Clinical Decision Making Stable/Uncomplicated    Clinical Decision Making Low    Rehab Potential Good    PT Frequency Other (comment)   3x/ week for 3 weeks progressing to 2x/ week.   PT Duration 6 weeks    PT Treatment/Interventions ADLs/Self Care Home Management;Cryotherapy;Electrical Stimulation;Moist Heat;Gait training;Stair training;Functional mobility training;Therapeutic activities;Therapeutic exercise;Balance training;Neuromuscular re-education;Patient/family education;Manual techniques;Passive range of motion;Taping    PT Next Visit Plan Nustep vs bike, SLR,  quad and hip strengthening, gait, balance, steps    PT Home Exercise Plan Access Code: [email or web address]  URL: [email or web address]  Date: 03/31/2021  Prepared by: Narda AmberJennifer     Exercises  Supine March - 3 x daily - 7 x weekly - 2 sets - 10 reps  Seated Long Arc Quad - 3 x daily - 7 x weekly - 2 sets - 10 reps  Supine Bridge - 3 x daily - 7 x weekly - 2 sets - 10 reps - 5 seconds hold  Seated Hip Adduction  Isometrics with Ball - 3 x daily - 7 x weekly - 2 sets - 10 reps - 5 seconds hold  Hooklying Clamshell with Resistance - 2 x daily - 7 x weekly - 2 sets - 10 reps - 5 seconds hold  Standing Hip Extension with Counter Support - 2 x daily - 7 x weekly - 2 sets - 10 reps  Standing Hip Abduction - 2 x daily - 7 x weekly - 2 sets - 10 reps  Heel rises with counter support - 2 x daily - 7 x weekly - 2 sets - 10 reps    Consulted and Agree with Plan of Care Patient           Patient will benefit from skilled therapeutic intervention in order to improve the following deficits and impairments:  Pain,Difficulty walking,Decreased balance,Impaired flexibility,Decreased activity tolerance,Decreased range of motion,Decreased strength  Visit Diagnosis: Pain in left hip  Muscle weakness (generalized)  Difficulty in walking, not elsewhere classified     Problem List Patient Active Problem List   Diagnosis Date Noted  . Status post total replacement of left hip 03/14/2021  . Unilateral primary osteoarthritis, left hip 01/15/2021    Sharmon Leyden, PT, MPT 03/31/2021, 4:12 PM  [facility] Physical Therapy [address] [place], [place], [address] Phone: [phone]   Fax:  [phone]  Name: Puneet Masoner MRN: [id number] Date of Birth: 1951-03-04

## 2021-04-01 ENCOUNTER — Encounter: Payer: Self-pay | Admitting: Physical Therapy

## 2021-04-01 ENCOUNTER — Ambulatory Visit (INDEPENDENT_AMBULATORY_CARE_PROVIDER_SITE_OTHER): Payer: Medicare Other | Admitting: Physical Therapy

## 2021-04-01 DIAGNOSIS — R262 Difficulty in walking, not elsewhere classified: Secondary | ICD-10-CM | POA: Diagnosis not present

## 2021-04-01 DIAGNOSIS — M6281 Muscle weakness (generalized): Secondary | ICD-10-CM | POA: Diagnosis not present

## 2021-04-01 DIAGNOSIS — M25552 Pain in left hip: Secondary | ICD-10-CM | POA: Diagnosis not present

## 2021-04-01 NOTE — Therapy (Signed)
[facility] Physical Therapy [address] [place], [place], [address] Phone: [phone]   Fax:  [phone]  Physical Therapy Treatment  Patient Details  Name: Joel Brown MRN: [id number] Date of Birth: [birth year] Referring Provider (PT): Doneen Poisson, MD   Encounter Date: 04/01/2021   PT End of Session - 04/01/21 1151    Visit Number 2    Number of Visits 15    Date for PT Re-Evaluation 05/16/21    Progress Note Due on Visit 10    PT Start Time 1140    PT Stop Time 1226    PT Time Calculation (min) 46 min    Equipment Utilized During Treatment Gait belt    Activity Tolerance Patient tolerated treatment well    Behavior During Therapy [facility] for tasks assessed/performed           Past Medical History:  Diagnosis Date  . Arthritis    fingers, hip  . Bilateral corneal abrasions    uses an ointment daily  . Heart murmur   . Hyperlipidemia   . Sleep apnea     Past Surgical History:  Procedure Laterality Date  . EYE SURGERY Bilateral 2010  . HERNIA REPAIR    . TONSILLECTOMY     as a child  . TOTAL HIP ARTHROPLASTY Left 03/14/2021   Procedure: LEFT TOTAL HIP ARTHROPLASTY ANTERIOR APPROACH;  Surgeon: Kathryne Hitch, MD;  Location: [facility];  Service: Orthopedics;  Laterality: Left;  . WISDOM TOOTH EXTRACTION      There were no vitals filed for this visit.   Subjective Assessment - 04/01/21 1147    Subjective Pt arriving today reporting his HEP went well last night. Pt reporting less than 1/10 pain today in his left hip.    Pertinent History left THA on 03/14/2021    How long can you sit comfortably? 90 minutes    How long can you walk comfortably? walking around the house without a problem    Diagnostic tests X-ray following surgery    Patient Stated Goals go to [place] in June and be able to walk    Currently in Pain? Yes    Pain Score 1     Pain Location Hip    Pain Orientation Left    Pain Descriptors / Indicators Discomfort     Pain Type Acute pain;Surgical pain    Pain Onset 1 to 4 weeks ago    Pain Frequency Intermittent    Aggravating Factors  prolonged standing and walking    Pain Relieving Factors resting, changing positions    Effect of Pain on Daily Activities difficulty walking longer distances, currently using a straight cane                             [facility] Adult PT Treatment/Exercise - 04/01/21 0001      Exercises   Exercises Knee/Hip      Knee/Hip Exercises: Stretches   Active Hamstring Stretch Left;3 reps;20 seconds    Gastroc Stretch 2 reps;30 seconds      Knee/Hip Exercises: Aerobic   Nustep L5 x 8 minutes      Knee/Hip Exercises: Machines for Strengthening   Cybex Leg Press 100# bilateral LE's 3x10, left LE only 31# 2 x 10      Knee/Hip Exercises: Standing   Heel Raises Both;15 reps    Heel Raises Limitations with toe raises    Hip Abduction Stengthening;Both;15 reps    Abduction  Limitations UE support    Lateral Step Up Both;15 reps;Hand Hold: 1;Step Height: 4"    Lateral Step Up Limitations "v" step    Forward Step Up Both;15 reps;Hand Hold: 1;Step Height: 4"      Knee/Hip Exercises: Supine   Heel Slides Strengthening;AROM;Left;15 reps    Heel Slides Limitations using slider disc under left heel    Financial planner;Both    Straight Leg Raises Limitations ankle propped on yellow ball, attempted SLR, pt unable to initiate lift off due to weakness      Modalities   Modalities Cryotherapy      Cryotherapy   Number Minutes Cryotherapy 5 Minutes    Cryotherapy Location Hip    Type of Cryotherapy Ice pack                    PT Short Term Goals - 04/01/21 1234      PT SHORT TERM GOAL #1   Title Pt will be independent in his initial HEP.    Time 2    Period Weeks    Status On-going      PT SHORT TERM GOAL #2   Title pt will be able to perform SLR on the L LE x 10 with no extensor lag.    Baseline unable to perform SLR on L LE currently     Status On-going             PT Long Term Goals - 03/31/21 1536      PT LONG TERM GOAL #1   Title pt will be independent in an advance HEP.    Time 6    Period Weeks    Status New    Target Date 05/16/21      PT LONG TERM GOAL #2   Title Pt will be able to perform 5 time  sit to stand with no UE support with no pain in </= 10 seconds    Baseline 14 seconds bilateral UE support and mild soreness reported.    Time 6    Period Weeks    Status New    Target Date 05/16/21      PT LONG TERM GOAL #3   Title Pt will be able to amb with no device for >/= 1000 feet on community surfaces.    Baseline amb household distances with straight cane    Time 6    Period Weeks    Status New    Target Date 05/16/21      PT LONG TERM GOAL #4   Title Pt will be able to climb a flight of stairs with single hand rail.    Time 6    Period Weeks    Status New    Target Date 05/16/21                 Plan - 04/01/21 1220    Clinical Impression Statement Pt arriving to therapy reporting less than 1/10 pain in his left hip. Pt tolerating strengthening exercises well, weakness noted in left hip flexors and left quad. Continue skilled PT toward goals set.    Personal Factors and Comorbidities Comorbidity 1    Comorbidities heart murmur, arthritis    Examination-Activity Limitations Squat;Stairs;Stand;Transfers;Lift    Examination-Participation Restrictions Community Activity;Other;Occupation    Stability/Clinical Decision Making Stable/Uncomplicated    Rehab Potential Good    PT Frequency Other (comment)    PT Duration 6 weeks    PT Treatment/Interventions ADLs/Self Care Home Management;Cryotherapy;Lobbyist  Stimulation;Moist Heat;Gait training;Stair training;Functional mobility training;Therapeutic activities;Therapeutic exercise;Balance training;Neuromuscular re-education;Patient/family education;Manual techniques;Passive range of motion;Taping    PT Next Visit Plan Nustep vs bike,  attempt SLR, hip flexor strengthening, leg press, gait, balance, steps    PT Home Exercise Plan Access Code: [email or web address]  URL: [email or web address]  Date: 03/31/2021  Prepared by: Narda Amber    Exercises  Supine March - 3 x daily - 7 x weekly - 2 sets - 10 reps  Seated Long Arc Quad - 3 x daily - 7 x weekly - 2 sets - 10 reps  Supine Bridge - 3 x daily - 7 x weekly - 2 sets - 10 reps - 5 seconds hold  Seated Hip Adduction Isometrics with Ball - 3 x daily - 7 x weekly - 2 sets - 10 reps - 5 seconds hold  Hooklying Clamshell with Resistance - 2 x daily - 7 x weekly - 2 sets - 10 reps - 5 seconds hold  Standing Hip Extension with Counter Support - 2 x daily - 7 x weekly - 2 sets - 10 reps  Standing Hip Abduction - 2 x daily - 7 x weekly - 2 sets - 10 reps  Heel rises with counter support - 2 x daily - 7 x weekly - 2 sets - 10 reps    Consulted and Agree with Plan of Care Patient           Patient will benefit from skilled therapeutic intervention in order to improve the following deficits and impairments:  Pain,Difficulty walking,Decreased balance,Impaired flexibility,Decreased activity tolerance,Decreased range of motion,Decreased strength  Visit Diagnosis: Pain in left hip  Muscle weakness (generalized)  Difficulty in walking, not elsewhere classified     Problem List Patient Active Problem List   Diagnosis Date Noted  . Status post total replacement of left hip 03/14/2021  . Unilateral primary osteoarthritis, left hip 01/15/2021    Sharmon Leyden, PT, MPT 04/01/2021, 12:36 PM  [facility] Physical Therapy [address] [place], [place], [address] Phone: [phone]   Fax:  [phone]  Name: Joel Brown MRN: [id number] Date of Birth: January 27, 1951

## 2021-04-02 ENCOUNTER — Other Ambulatory Visit: Payer: Self-pay | Admitting: Orthopaedic Surgery

## 2021-04-02 ENCOUNTER — Encounter: Payer: Medicare Other | Admitting: Rehabilitative and Restorative Service Providers"

## 2021-04-02 NOTE — Telephone Encounter (Signed)
03/14/21 surgery Continue?

## 2021-04-03 ENCOUNTER — Ambulatory Visit (INDEPENDENT_AMBULATORY_CARE_PROVIDER_SITE_OTHER): Payer: Medicare Other | Admitting: Physical Therapy

## 2021-04-03 ENCOUNTER — Other Ambulatory Visit: Payer: Self-pay

## 2021-04-03 DIAGNOSIS — R262 Difficulty in walking, not elsewhere classified: Secondary | ICD-10-CM | POA: Diagnosis not present

## 2021-04-03 DIAGNOSIS — M6281 Muscle weakness (generalized): Secondary | ICD-10-CM

## 2021-04-03 DIAGNOSIS — M25552 Pain in left hip: Secondary | ICD-10-CM | POA: Diagnosis not present

## 2021-04-03 NOTE — Therapy (Signed)
[facility] Physical Therapy [address] [place], [place], [address] Phone: [phone]   Fax:  [phone]  Physical Therapy Treatment  Patient Details  Name: Joel Brown MRN: [id number] Date of Birth: June 13, 1951 Referring Provider (PT): Doneen Poisson, MD   Encounter Date: 04/03/2021   PT End of Session - 04/03/21 0919    Visit Number 3    Number of Visits 15    Date for PT Re-Evaluation 05/16/21    Progress Note Due on Visit 10    PT Start Time 0845    PT Stop Time 0924    PT Time Calculation (min) 39 min    Equipment Utilized During Treatment Gait belt    Activity Tolerance Patient tolerated treatment well    Behavior During Therapy [facility] for tasks assessed/performed           Past Medical History:  Diagnosis Date  . Arthritis    fingers, hip  . Bilateral corneal abrasions    uses an ointment daily  . Heart murmur   . Hyperlipidemia   . Sleep apnea     Past Surgical History:  Procedure Laterality Date  . EYE SURGERY Bilateral 2010  . HERNIA REPAIR    . TONSILLECTOMY     as a child  . TOTAL HIP ARTHROPLASTY Left 03/14/2021   Procedure: LEFT TOTAL HIP ARTHROPLASTY ANTERIOR APPROACH;  Surgeon: Kathryne Hitch, MD;  Location: [facility];  Service: Orthopedics;  Laterality: Left;  . WISDOM TOOTH EXTRACTION      There were no vitals filed for this visit.   Subjective Assessment - 04/03/21 0857    Subjective Pt arriving today reporting 1/10 Lt hip pain and that it is feeling overall good.    Pertinent History left THA on 03/14/2021    How long can you sit comfortably? 90 minutes    How long can you walk comfortably? walking around the house without a problem    Diagnostic tests X-ray following surgery    Patient Stated Goals go to [place] in June and be able to walk    Pain Onset 1 to 4 weeks ago            [facility] Adult PT Treatment/Exercise - 04/03/21 0001      Knee/Hip Exercises: Stretches   Gastroc Stretch Both;3 reps;30  seconds      Knee/Hip Exercises: Aerobic   Nustep L6 X 8 min      Knee/Hip Exercises: Machines for Strengthening   Cybex Leg Press 100# bilateral LE's 3x10, left LE only 31# 2 x 10      Knee/Hip Exercises: Standing   Heel Raises Both;20 reps    Heel Raises Limitations with toe raises    Knee Flexion Both;15 reps    Knee Flexion Limitations UE support and 2#    Hip Flexion Both;15 reps    Hip Flexion Limitations UE support and 2#    Hip Abduction Stengthening;Both;15 reps    Abduction Limitations UE support and 2#      Knee/Hip Exercises: Seated   Long Arc Quad Left;3 sets;10 reps    Long Arc Quad Weight 2 lbs.    Other Seated Knee/Hip Exercises seated SLR on Lt 3X5      Knee/Hip Exercises: Supine   Bridges Both;2 sets;10 reps    Straight Leg Raises Left;3 sets;5 reps                    PT Short Term Goals - 04/01/21 1234  PT SHORT TERM GOAL #1   Title Pt will be independent in his initial HEP.    Time 2    Period Weeks    Status On-going      PT SHORT TERM GOAL #2   Title pt will be able to perform SLR on the L LE x 10 with no extensor lag.    Baseline unable to perform SLR on L LE currently    Status On-going             PT Long Term Goals - 03/31/21 1536      PT LONG TERM GOAL #1   Title pt will be independent in an advance HEP.    Time 6    Period Weeks    Status New    Target Date 05/16/21      PT LONG TERM GOAL #2   Title Pt will be able to perform 5 time  sit to stand with no UE support with no pain in </= 10 seconds    Baseline 14 seconds bilateral UE support and mild soreness reported.    Time 6    Period Weeks    Status New    Target Date 05/16/21      PT LONG TERM GOAL #3   Title Pt will be able to amb with no device for >/= 1000 feet on community surfaces.    Baseline amb household distances with straight cane    Time 6    Period Weeks    Status New    Target Date 05/16/21      PT LONG TERM GOAL #4   Title Pt will be  able to climb a flight of stairs with single hand rail.    Time 6    Period Weeks    Status New    Target Date 05/16/21                 Plan - 04/03/21 0920    Clinical Impression Statement Continued to work to improve Lt hip strength with focus on hip flexors and knee extensors. He showed good tolerance to all exercises and declined needing ice today. He is overall progressing as expected, continue POC.    Personal Factors and Comorbidities Comorbidity 1    Comorbidities heart murmur, arthritis    Examination-Activity Limitations Squat;Stairs;Stand;Transfers;Lift    Examination-Participation Restrictions Community Activity;Other;Occupation    Stability/Clinical Decision Making Stable/Uncomplicated    Rehab Potential Good    PT Frequency Other (comment)    PT Duration 6 weeks    PT Treatment/Interventions ADLs/Self Care Home Management;Cryotherapy;Electrical Stimulation;Moist Heat;Gait training;Stair training;Functional mobility training;Therapeutic activities;Therapeutic exercise;Balance training;Neuromuscular re-education;Patient/family education;Manual techniques;Passive range of motion;Taping    PT Next Visit Plan Nustep vs bike,  SLR with lots of breaks due fatigue, hip flexor strengthening, leg press, gait, balance, steps    PT Home Exercise Plan Access Code: [email or web address]  URL: [email or web address]  Date: 03/31/2021  Prepared by: Narda Amber    Exercises  Supine March - 3 x daily - 7 x weekly - 2 sets - 10 reps  Seated Long Arc Quad - 3 x daily - 7 x weekly - 2 sets - 10 reps  Supine Bridge - 3 x daily - 7 x weekly - 2 sets - 10 reps - 5 seconds hold  Seated Hip Adduction Isometrics with Ball - 3 x daily - 7 x weekly - 2 sets - 10 reps - 5 seconds hold  Hooklying Clamshell with Resistance - 2  x daily - 7 x weekly - 2 sets - 10 reps - 5 seconds hold  Standing Hip Extension with Counter Support - 2 x daily - 7 x weekly - 2 sets - 10 reps  Standing Hip Abduction - 2 x  daily - 7 x weekly - 2 sets - 10 reps  Heel rises with counter support - 2 x daily - 7 x weekly - 2 sets - 10 reps    Consulted and Agree with Plan of Care Patient           Patient will benefit from skilled therapeutic intervention in order to improve the following deficits and impairments:  Pain,Difficulty walking,Decreased balance,Impaired flexibility,Decreased activity tolerance,Decreased range of motion,Decreased strength  Visit Diagnosis: Pain in left hip  Muscle weakness (generalized)  Difficulty in walking, not elsewhere classified     Problem List Patient Active Problem List   Diagnosis Date Noted  . Status post total replacement of left hip 03/14/2021  . Unilateral primary osteoarthritis, left hip 01/15/2021    Birdie Riddle 04/03/2021, 9:21 AM  [facility] Physical Therapy [address] [place], [place], [address] Phone: [phone]   Fax:  [phone]  Name: Joel Brown MRN: [id number] Date of Birth: 1951/10/31

## 2021-04-07 ENCOUNTER — Ambulatory Visit (INDEPENDENT_AMBULATORY_CARE_PROVIDER_SITE_OTHER): Payer: Medicare Other | Admitting: Physical Therapy

## 2021-04-07 ENCOUNTER — Other Ambulatory Visit: Payer: Self-pay

## 2021-04-07 ENCOUNTER — Encounter: Payer: Self-pay | Admitting: Physical Therapy

## 2021-04-07 DIAGNOSIS — R262 Difficulty in walking, not elsewhere classified: Secondary | ICD-10-CM

## 2021-04-07 DIAGNOSIS — M6281 Muscle weakness (generalized): Secondary | ICD-10-CM

## 2021-04-07 DIAGNOSIS — M25552 Pain in left hip: Secondary | ICD-10-CM

## 2021-04-07 NOTE — Therapy (Signed)
[facility] Physical Therapy [address] [place], [place], [address] Phone: [phone]   Fax:  [phone]  Physical Therapy Treatment  Patient Details  Name: Joel Brown MRN: [id number] Date of Birth: 05-23-1951 Referring Provider (PT): Doneen Poisson, MD   Encounter Date: 04/07/2021   PT End of Session - 04/07/21 1523    Visit Number 4    Number of Visits 15    Date for PT Re-Evaluation 05/16/21    Progress Note Due on Visit 10    PT Start Time 1515    PT Stop Time 1555    PT Time Calculation (min) 40 min    Equipment Utilized During Treatment Gait belt    Activity Tolerance Patient tolerated treatment well    Behavior During Therapy [facility] for tasks assessed/performed           Past Medical History:  Diagnosis Date  . Arthritis    fingers, hip  . Bilateral corneal abrasions    uses an ointment daily  . Heart murmur   . Hyperlipidemia   . Sleep apnea     Past Surgical History:  Procedure Laterality Date  . EYE SURGERY Bilateral 2010  . HERNIA REPAIR    . TONSILLECTOMY     as a child  . TOTAL HIP ARTHROPLASTY Left 03/14/2021   Procedure: LEFT TOTAL HIP ARTHROPLASTY ANTERIOR APPROACH;  Surgeon: Kathryne Hitch, MD;  Location: [facility];  Service: Orthopedics;  Laterality: Left;  . WISDOM TOOTH EXTRACTION      There were no vitals filed for this visit.   Subjective Assessment - 04/07/21 1519    Subjective Pt arriving today reporting 1-2/10 pain more achiness. Pt reporting he has been sitting more lately due to going back to work.    Pertinent History left THA on 03/14/2021    How long can you sit comfortably? 90 minutes    How long can you walk comfortably? walking around the house without a problem    Diagnostic tests X-ray following surgery    Patient Stated Goals go to [place] in June and be able to walk    Currently in Pain? Yes    Pain Score 2     Pain Location Hip    Pain Orientation Left    Pain Descriptors /  Indicators Aching    Pain Type Surgical pain    Pain Onset 1 to 4 weeks ago                             [facility] Adult PT Treatment/Exercise - 04/07/21 0001      Exercises   Exercises Knee/Hip      Knee/Hip Exercises: Stretches   Gastroc Stretch Both;3 reps;30 seconds      Knee/Hip Exercises: Aerobic   Recumbent Bike L 3 x 6 minutes      Knee/Hip Exercises: Machines for Strengthening   Cybex Leg Press L LE only 50# x 20 reps      Knee/Hip Exercises: Standing   Hip Abduction Stengthening;Both;15 reps    Abduction Limitations UE support and 2#    Lateral Step Up 15 reps    Lateral Step Up Limitations "v" step leading with left LE    Forward Step Up 20 reps;Step Height: 6"      Knee/Hip Exercises: Seated   Long Arc Quad Left;3 sets;10 reps    Long Arc Quad Weight 2 lbs.    Other Seated Knee/Hip Exercises seated SLR  on Lt 3X5      Knee/Hip Exercises: Supine   Bridges Both;2 sets;10 reps                    PT Short Term Goals - 04/07/21 1527      PT SHORT TERM GOAL #1   Title Pt will be independent in his initial HEP.    Status On-going      PT SHORT TERM GOAL #2   Title pt will be able to perform SLR on the L LE x 10 with no extensor lag.    Baseline unable to perform SLR on L LE currently    Status On-going             PT Long Term Goals - 04/07/21 1529      PT LONG TERM GOAL #1   Title pt will be independent in an advance HEP.    Status On-going      PT LONG TERM GOAL #2   Title Pt will be able to perform 5 time  sit to stand with no UE support with no pain in </= 10 seconds    Status On-going      PT LONG TERM GOAL #3   Title Pt will be able to amb with no device for >/= 1000 feet on community surfaces.    Status On-going      PT LONG TERM GOAL #4   Title Pt will be able to climb a flight of stairs with single hand rail.    Status On-going                 Plan - 04/07/21 1523    Clinical Impression Statement Pt  arriving today reporting more achiness 1-2/10 pain in left hip from sitting more at work. Pt instructed to get up at least once every hour as allowed. Pt tolerating strengtheing exercises well today. Continue skilled PT.    Personal Factors and Comorbidities Comorbidity 1    Comorbidities heart murmur, arthritis    Examination-Activity Limitations Squat;Stairs;Stand;Transfers;Lift    Examination-Participation Restrictions Community Activity;Other;Occupation    Stability/Clinical Decision Making Stable/Uncomplicated    Rehab Potential Good    PT Frequency Other (comment)    PT Duration 6 weeks    PT Treatment/Interventions ADLs/Self Care Home Management;Cryotherapy;Electrical Stimulation;Moist Heat;Gait training;Stair training;Functional mobility training;Therapeutic activities;Therapeutic exercise;Balance training;Neuromuscular re-education;Patient/family education;Manual techniques;Passive range of motion;Taping    PT Next Visit Plan bike,  SLR, hip flexor strengthening, leg press, gait, balance, steps    PT Home Exercise Plan Access Code: [email or web address]  URL: [email or web address]  Date: 03/31/2021  Prepared by: Narda Amber    Exercises  Supine March - 3 x daily - 7 x weekly - 2 sets - 10 reps  Seated Long Arc Quad - 3 x daily - 7 x weekly - 2 sets - 10 reps  Supine Bridge - 3 x daily - 7 x weekly - 2 sets - 10 reps - 5 seconds hold  Seated Hip Adduction Isometrics with Ball - 3 x daily - 7 x weekly - 2 sets - 10 reps - 5 seconds hold  Hooklying Clamshell with Resistance - 2 x daily - 7 x weekly - 2 sets - 10 reps - 5 seconds hold  Standing Hip Extension with Counter Support - 2 x daily - 7 x weekly - 2 sets - 10 reps  Standing Hip Abduction - 2 x daily - 7 x weekly - 2 sets - 10 reps  Heel rises with counter support - 2 x daily - 7 x weekly - 2 sets - 10 reps    Consulted and Agree with Plan of Care Patient           Patient will benefit from skilled therapeutic intervention in order  to improve the following deficits and impairments:  Pain,Difficulty walking,Decreased balance,Impaired flexibility,Decreased activity tolerance,Decreased range of motion,Decreased strength  Visit Diagnosis: Pain in left hip  Muscle weakness (generalized)  Difficulty in walking, not elsewhere classified     Problem List Patient Active Problem List   Diagnosis Date Noted  . Status post total replacement of left hip 03/14/2021  . Unilateral primary osteoarthritis, left hip 01/15/2021    Sharmon Leyden, PT, MPT 04/07/2021, 4:01 PM  [facility] Physical Therapy [address] [place], [place], [address] Phone: [phone]   Fax:  [phone]  Name: Joel Brown MRN: [id number] Date of Birth: 07-11-1951

## 2021-04-09 ENCOUNTER — Ambulatory Visit (INDEPENDENT_AMBULATORY_CARE_PROVIDER_SITE_OTHER): Payer: Medicare Other | Admitting: Physical Therapy

## 2021-04-09 ENCOUNTER — Other Ambulatory Visit: Payer: Self-pay

## 2021-04-09 ENCOUNTER — Encounter: Payer: Self-pay | Admitting: Physical Therapy

## 2021-04-09 DIAGNOSIS — M6281 Muscle weakness (generalized): Secondary | ICD-10-CM | POA: Diagnosis not present

## 2021-04-09 DIAGNOSIS — M25552 Pain in left hip: Secondary | ICD-10-CM | POA: Diagnosis not present

## 2021-04-09 DIAGNOSIS — R262 Difficulty in walking, not elsewhere classified: Secondary | ICD-10-CM | POA: Diagnosis not present

## 2021-04-09 NOTE — Therapy (Signed)
[facility] Physical Therapy [address] [place], [place], [address] Phone: [phone]   Fax:  [phone]  Physical Therapy Treatment  Patient Details  Name: Joel Brown MRN: [id number] Date of Birth: [birth year] Referring Provider (PT): Doneen Poisson, MD   Encounter Date: 04/09/2021   PT End of Session - 04/09/21 0913    Visit Number 5    Number of Visits 15    Date for PT Re-Evaluation 05/16/21    Progress Note Due on Visit 10    PT Start Time 0845    PT Stop Time 0925    PT Time Calculation (min) 40 min    Equipment Utilized During Treatment Gait belt    Activity Tolerance Patient tolerated treatment well    Behavior During Therapy [facility] for tasks assessed/performed           Past Medical History:  Diagnosis Date  . Arthritis    fingers, hip  . Bilateral corneal abrasions    uses an ointment daily  . Heart murmur   . Hyperlipidemia   . Sleep apnea     Past Surgical History:  Procedure Laterality Date  . EYE SURGERY Bilateral 2010  . HERNIA REPAIR    . TONSILLECTOMY     as a child  . TOTAL HIP ARTHROPLASTY Left 03/14/2021   Procedure: LEFT TOTAL HIP ARTHROPLASTY ANTERIOR APPROACH;  Surgeon: Kathryne Hitch, MD;  Location: [facility];  Service: Orthopedics;  Laterality: Left;  . WISDOM TOOTH EXTRACTION      There were no vitals filed for this visit.   Subjective Assessment - 04/09/21 0910    Subjective Pt arriving to therapy today reporting pain 1-2/10 first thing in the morning, more stiffnes. Pt had a longer day working on 23-Apr-2023 meeting a dead line and he was sitting more which he feels may have aggrivated his left hip.    Pertinent History left THA on 03/14/2021    How long can you sit comfortably? 90 minutes    How long can you walk comfortably? walking around the house without a problem    Diagnostic tests X-ray following surgery    Patient Stated Goals go to [place] in June and be able to walk    Currently in Pain? Yes     Pain Score 1     Pain Location Hip    Pain Orientation Left    Pain Descriptors / Indicators Sore    Pain Type Surgical pain    Pain Onset 1 to 4 weeks ago                             [facility] Adult PT Treatment/Exercise - 04/09/21 0001      Exercises   Exercises Knee/Hip      Knee/Hip Exercises: Aerobic   Nustep L6 x 10 minutes      Knee/Hip Exercises: Machines for Strengthening   Cybex Leg Press L LE only 62# 3x10, bilateral LE's 112# 3x10      Knee/Hip Exercises: Standing   Hip Abduction Stengthening;Both;3 sets;10 reps    Abduction Limitations red theraband    Hip Extension Stengthening;Left;Knee straight;Limitations    Extension Limitations 3x10 red theraband    Rocker Board 2 minutes;Limitations    Rocker Board Limitations forward/back and side/side each 2 minutes    SLS Airex: L LE 2 minutes intermittent UE support    Other Standing Knee Exercises TRX squats 2x10      Knee/Hip  Exercises: Seated   Other Seated Knee/Hip Exercises seated SLR on Lt 3X10                    PT Short Term Goals - 04/09/21 0919      PT SHORT TERM GOAL #1   Title Pt will be independent in his initial HEP.    Status On-going      PT SHORT TERM GOAL #2   Title pt will be able to perform SLR on the L LE x 10 with no extensor lag.    Status On-going             PT Long Term Goals - 04/07/21 1529      PT LONG TERM GOAL #1   Title pt will be independent in an advance HEP.    Status On-going      PT LONG TERM GOAL #2   Title Pt will be able to perform 5 time  sit to stand with no UE support with no pain in </= 10 seconds    Status On-going      PT LONG TERM GOAL #3   Title Pt will be able to amb with no device for >/= 1000 feet on community surfaces.    Status On-going      PT LONG TERM GOAL #4   Title Pt will be able to climb a flight of stairs with single hand rail.    Status On-going                 Plan - 04/09/21 0915    Clinical  Impression Statement Pt arriving reporting more soreness and stiffness first thing in the morning especially after sittting a longer time on Monday while working. Pt tolerating more dyanmic balance and LE strengtheing today. Cotninue PT POC.    Personal Factors and Comorbidities Comorbidity 1    Comorbidities heart murmur, arthritis    Examination-Activity Limitations Squat;Stairs;Stand;Transfers;Lift    Examination-Participation Restrictions Community Activity;Other;Occupation    Stability/Clinical Decision Making Stable/Uncomplicated    Rehab Potential Good    PT Frequency Other (comment)    PT Duration 6 weeks    PT Treatment/Interventions ADLs/Self Care Home Management;Cryotherapy;Electrical Stimulation;Moist Heat;Gait training;Stair training;Functional mobility training;Therapeutic activities;Therapeutic exercise;Balance training;Neuromuscular re-education;Patient/family education;Manual techniques;Passive range of motion;Taping    PT Next Visit Plan bike vs Nustep,  SLR, hip flexor strengthening, leg press, gait, balance, steps    PT Home Exercise Plan Access Code: [email or web address]  URL: [email or web address]  Date: 03/31/2021  Prepared by: Narda Amber    Exercises  Supine March - 3 x daily - 7 x weekly - 2 sets - 10 reps  Seated Long Arc Quad - 3 x daily - 7 x weekly - 2 sets - 10 reps  Supine Bridge - 3 x daily - 7 x weekly - 2 sets - 10 reps - 5 seconds hold  Seated Hip Adduction Isometrics with Ball - 3 x daily - 7 x weekly - 2 sets - 10 reps - 5 seconds hold  Hooklying Clamshell with Resistance - 2 x daily - 7 x weekly - 2 sets - 10 reps - 5 seconds hold  Standing Hip Extension with Counter Support - 2 x daily - 7 x weekly - 2 sets - 10 reps  Standing Hip Abduction - 2 x daily - 7 x weekly - 2 sets - 10 reps  Heel rises with counter support - 2 x daily - 7 x weekly - 2 sets -  10 reps    Consulted and Agree with Plan of Care Patient           Patient will benefit from skilled  therapeutic intervention in order to improve the following deficits and impairments:  Pain,Difficulty walking,Decreased balance,Impaired flexibility,Decreased activity tolerance,Decreased range of motion,Decreased strength  Visit Diagnosis: Pain in left hip  Muscle weakness (generalized)  Difficulty in walking, not elsewhere classified     Problem List Patient Active Problem List   Diagnosis Date Noted  . Status post total replacement of left hip 03/14/2021  . Unilateral primary osteoarthritis, left hip 01/15/2021    Sharmon Leyden , PT, MPT 04/09/2021, 9:29 AM  [facility] Physical Therapy [address] [place], [place], [address] Phone: [phone]   Fax:  [phone]  Name: Joel Brown MRN: [id number] Date of Birth: 1951-09-16

## 2021-04-11 ENCOUNTER — Ambulatory Visit (INDEPENDENT_AMBULATORY_CARE_PROVIDER_SITE_OTHER): Payer: Medicare Other | Admitting: Physical Therapy

## 2021-04-11 ENCOUNTER — Other Ambulatory Visit: Payer: Self-pay

## 2021-04-11 ENCOUNTER — Encounter: Payer: Self-pay | Admitting: Physical Therapy

## 2021-04-11 DIAGNOSIS — M25552 Pain in left hip: Secondary | ICD-10-CM | POA: Diagnosis not present

## 2021-04-11 DIAGNOSIS — M6281 Muscle weakness (generalized): Secondary | ICD-10-CM | POA: Diagnosis not present

## 2021-04-11 DIAGNOSIS — R262 Difficulty in walking, not elsewhere classified: Secondary | ICD-10-CM

## 2021-04-11 NOTE — Therapy (Signed)
[facility] Physical Therapy [address] [place], [place], [address] Phone: [phone]   Fax:  [phone]  Physical Therapy Treatment  Patient Details  Name: Joel Brown MRN: [id number] Date of Birth: 11-Apr-1951 Referring Provider (PT): Doneen Poisson, MD   Encounter Date: 04/11/2021   PT End of Session - 04/11/21 0923    Visit Number 6    Number of Visits 15    Date for PT Re-Evaluation 05/16/21    Progress Note Due on Visit 10    PT Start Time 0843    PT Stop Time 0924    PT Time Calculation (min) 41 min    Equipment Utilized During Treatment Gait belt    Activity Tolerance Patient tolerated treatment well    Behavior During Therapy [facility] for tasks assessed/performed           Past Medical History:  Diagnosis Date  . Arthritis    fingers, hip  . Bilateral corneal abrasions    uses an ointment daily  . Heart murmur   . Hyperlipidemia   . Sleep apnea     Past Surgical History:  Procedure Laterality Date  . EYE SURGERY Bilateral 2010  . HERNIA REPAIR    . TONSILLECTOMY     as a child  . TOTAL HIP ARTHROPLASTY Left 03/14/2021   Procedure: LEFT TOTAL HIP ARTHROPLASTY ANTERIOR APPROACH;  Surgeon: Kathryne Hitch, MD;  Location: [facility];  Service: Orthopedics;  Laterality: Left;  . WISDOM TOOTH EXTRACTION      There were no vitals filed for this visit.   Subjective Assessment - 04/11/21 0845    Subjective doing well, a little stiff this morning but no real complaints    Pertinent History left THA on 03/14/2021    How long can you sit comfortably? 90 minutes    How long can you walk comfortably? walking around the house without a problem    Diagnostic tests X-ray following surgery    Patient Stated Goals go to [place] in June and be able to walk    Currently in Pain? Yes    Pain Score 1     Pain Location Hip    Pain Orientation Left    Pain Descriptors / Indicators Sore;Tightness    Pain Type Surgical pain    Pain Onset 1 to  4 weeks ago    Pain Frequency Intermittent    Aggravating Factors  prolonged standing and walking    Pain Relieving Factors resting, changing positions                             [facility] Adult PT Treatment/Exercise - 04/11/21 0847      Knee/Hip Exercises: Aerobic   Nustep L7 x 10 minutes      Knee/Hip Exercises: Machines for Strengthening   Cybex Leg Press L LE only 62# 3x10, bilateral LE's 112# 3x10      Knee/Hip Exercises: Standing   Hip Flexion Left;3 sets;10 reps;Knee bent    Hip Flexion Limitations 2#; RUE support    Forward Step Up Both;2 sets;10 reps;Hand Hold: 0;Step Height: 6"      Knee/Hip Exercises: Seated   Long Arc Quad Left;3 sets;10 reps    Long Arc Quad Weight 2 lbs.    Marching Left;3 sets;10 reps   5 sec hold; 2# last 5 reps                   PT Short  Term Goals - 04/09/21 0919      PT SHORT TERM GOAL #1   Title Pt will be independent in his initial HEP.    Status On-going      PT SHORT TERM GOAL #2   Title pt will be able to perform SLR on the L LE x 10 with no extensor lag.    Status On-going             PT Long Term Goals - 04/07/21 1529      PT LONG TERM GOAL #1   Title pt will be independent in an advance HEP.    Status On-going      PT LONG TERM GOAL #2   Title Pt will be able to perform 5 time  sit to stand with no UE support with no pain in </= 10 seconds    Status On-going      PT LONG TERM GOAL #3   Title Pt will be able to amb with no device for >/= 1000 feet on community surfaces.    Status On-going      PT LONG TERM GOAL #4   Title Pt will be able to climb a flight of stairs with single hand rail.    Status On-going                 Plan - 04/11/21 6629    Clinical Impression Statement Pt tolerated session well today and focus on Rt hip strengthening and balance with expected fatigue after session.  Will continue to benefit from PT to maximize function.    Personal Factors and Comorbidities  Comorbidity 1    Comorbidities heart murmur, arthritis    Examination-Activity Limitations Squat;Stairs;Stand;Transfers;Lift    Examination-Participation Restrictions Community Activity;Other;Occupation    Stability/Clinical Decision Making Stable/Uncomplicated    Rehab Potential Good    PT Frequency Other (comment)    PT Duration 6 weeks    PT Treatment/Interventions ADLs/Self Care Home Management;Cryotherapy;Electrical Stimulation;Moist Heat;Gait training;Stair training;Functional mobility training;Therapeutic activities;Therapeutic exercise;Balance training;Neuromuscular re-education;Patient/family education;Manual techniques;Passive range of motion;Taping    PT Next Visit Plan bike vs Nustep,  SLR, hip flexor strengthening, leg press, gait, balance, steps; check STGs    PT Home Exercise Plan Access Code: [email or web address]    Consulted and Agree with Plan of Care Patient           Patient will benefit from skilled therapeutic intervention in order to improve the following deficits and impairments:  Pain,Difficulty walking,Decreased balance,Impaired flexibility,Decreased activity tolerance,Decreased range of motion,Decreased strength  Visit Diagnosis: Pain in left hip  Muscle weakness (generalized)  Difficulty in walking, not elsewhere classified     Problem List Patient Active Problem List   Diagnosis Date Noted  . Status post total replacement of left hip 03/14/2021  . Unilateral primary osteoarthritis, left hip 01/15/2021      Clarita Crane, PT, DPT 04/11/21 9:25 AM     [facility] Physical Therapy [address] [place], [place], [address] Phone: [phone]   Fax:  [phone]  Name: Elonzo Sopp MRN: [id number] Date of Birth: 1951-08-27

## 2021-04-14 ENCOUNTER — Other Ambulatory Visit: Payer: Self-pay

## 2021-04-14 ENCOUNTER — Encounter: Payer: Self-pay | Admitting: Physical Therapy

## 2021-04-14 ENCOUNTER — Ambulatory Visit (INDEPENDENT_AMBULATORY_CARE_PROVIDER_SITE_OTHER): Payer: Medicare Other | Admitting: Physical Therapy

## 2021-04-14 DIAGNOSIS — M6281 Muscle weakness (generalized): Secondary | ICD-10-CM

## 2021-04-14 DIAGNOSIS — R262 Difficulty in walking, not elsewhere classified: Secondary | ICD-10-CM

## 2021-04-14 DIAGNOSIS — M25552 Pain in left hip: Secondary | ICD-10-CM

## 2021-04-14 NOTE — Therapy (Signed)
[facility] Physical Therapy [address] [place], [place], [address] Phone: [phone]   Fax:  [phone]  Physical Therapy Treatment  Patient Details  Name: Joel Brown MRN: [id number] Date of Birth: Apr 12, 1951 Referring Provider (PT): Doneen Poisson, MD   Encounter Date: 04/14/2021   PT End of Session - 04/14/21 0934    Visit Number 7    Number of Visits 15    Date for PT Re-Evaluation 05/16/21    Progress Note Due on Visit 10    PT Start Time 0934    PT Stop Time 1013    PT Time Calculation (min) 39 min    Activity Tolerance Patient tolerated treatment well    Behavior During Therapy [facility] for tasks assessed/performed           Past Medical History:  Diagnosis Date  . Arthritis    fingers, hip  . Bilateral corneal abrasions    uses an ointment daily  . Heart murmur   . Hyperlipidemia   . Sleep apnea     Past Surgical History:  Procedure Laterality Date  . EYE SURGERY Bilateral 2010  . HERNIA REPAIR    . TONSILLECTOMY     as a child  . TOTAL HIP ARTHROPLASTY Left 03/14/2021   Procedure: LEFT TOTAL HIP ARTHROPLASTY ANTERIOR APPROACH;  Surgeon: Kathryne Hitch, MD;  Location: [facility];  Service: Orthopedics;  Laterality: Left;  . WISDOM TOOTH EXTRACTION      There were no vitals filed for this visit.   Subjective Assessment - 04/14/21 0934    Subjective every day the hip gets better, doesn't really have pain only soreness    Patient Stated Goals go to [place] in June and be able to walk    Currently in Pain? Yes    Pain Score 1     Pain Location Hip    Pain Orientation Left    Pain Descriptors / Indicators Sore    Pain Type Surgical pain    Pain Onset More than a month ago    Pain Frequency Intermittent              [facility] PT Assessment - 04/14/21 0001      Assessment   Medical Diagnosis Z96.642, left THA    Referring Provider (PT) Doneen Poisson, MD                         [facility] Adult  PT Treatment/Exercise - 04/14/21 0001      Knee/Hip Exercises: Stretches   Active Hamstring Stretch Both   10reps   Other Knee/Hip Stretches 10 reps each SKTC, LTR and hip rotation, leg lengtheners      Knee/Hip Exercises: Aerobic   Recumbent Bike L 3 x 7 minutes      Knee/Hip Exercises: Standing   SLS 2x10 divers,    SLS with Vectors 2x10      Knee/Hip Exercises: Seated   Sit to Sand 2 sets;10 reps;with UE support   single leg Lt from high surface     Knee/Hip Exercises: Supine   Single Leg Bridge Both;2 sets;10 reps    Other Supine Knee/Hip Exercises isometric hip flexion in hooklying                    PT Short Term Goals - 04/09/21 0919      PT SHORT TERM GOAL #1   Title Pt will be independent in his initial HEP.  Status On-going      PT SHORT TERM GOAL #2   Title pt will be able to perform SLR on the L LE x 10 with no extensor lag.    Status On-going             PT Long Term Goals - 04/07/21 1529      PT LONG TERM GOAL #1   Title pt will be independent in an advance HEP.    Status On-going      PT LONG TERM GOAL #2   Title Pt will be able to perform 5 time  sit to stand with no UE support with no pain in </= 10 seconds    Status On-going      PT LONG TERM GOAL #3   Title Pt will be able to amb with no device for >/= 1000 feet on community surfaces.    Status On-going      PT LONG TERM GOAL #4   Title Pt will be able to climb a flight of stairs with single hand rail.    Status On-going                 Plan - 04/14/21 0938    Clinical Impression Statement Pt is going to [place] on June 2, his biggest concern is being able to walk and catch himself if he loses his balance walking on coblestones. HEP was progressed to further progress standing stability and hip strength.  He struggled with single leg sit to stands due to weakness and decreased proprioception. Making good progress towards goals and improving function .    Rehab Potential  Good    PT Duration 6 weeks    PT Treatment/Interventions ADLs/Self Care Home Management;Cryotherapy;Electrical Stimulation;Moist Heat;Gait training;Stair training;Functional mobility training;Therapeutic activities;Therapeutic exercise;Balance training;Neuromuscular re-education;Patient/family education;Manual techniques;Passive range of motion;Taping    PT Next Visit Plan assess response to new HEP, continue to work on proprioception.    PT Home Exercise Plan Access Code: [email or web address]    Consulted and Agree with Plan of Care Patient           Patient will benefit from skilled therapeutic intervention in order to improve the following deficits and impairments:  Pain,Difficulty walking,Decreased balance,Impaired flexibility,Decreased activity tolerance,Decreased range of motion,Decreased strength  Visit Diagnosis: Pain in left hip  Muscle weakness (generalized)  Difficulty in walking, not elsewhere classified     Problem List Patient Active Problem List   Diagnosis Date Noted  . Status post total replacement of left hip 03/14/2021  . Unilateral primary osteoarthritis, left hip 01/15/2021    Roderic Scarce PT  04/14/2021, 10:14 AM  [facility] Physical Therapy [address] [place], [place], [address] Phone: [phone]   Fax:  [phone]  Name: Joel Brown MRN: [id number] Date of Birth: August 21, 1951

## 2021-04-15 ENCOUNTER — Ambulatory Visit (INDEPENDENT_AMBULATORY_CARE_PROVIDER_SITE_OTHER): Payer: Medicare Other | Admitting: Physical Therapy

## 2021-04-15 ENCOUNTER — Encounter: Payer: Self-pay | Admitting: Physical Therapy

## 2021-04-15 DIAGNOSIS — M25552 Pain in left hip: Secondary | ICD-10-CM | POA: Diagnosis not present

## 2021-04-15 DIAGNOSIS — R262 Difficulty in walking, not elsewhere classified: Secondary | ICD-10-CM

## 2021-04-15 DIAGNOSIS — M6281 Muscle weakness (generalized): Secondary | ICD-10-CM | POA: Diagnosis not present

## 2021-04-15 NOTE — Therapy (Signed)
[facility] Physical Therapy [address] [place], [place], [address] Phone: [phone]   Fax:  [phone]  Physical Therapy Treatment  Patient Details  Name: Joel Brown MRN: [id number] Date of Birth: 1951/03/24 Referring Provider (PT): Doneen Poisson, MD   Encounter Date: 04/15/2021   PT End of Session - 04/15/21 0926    Visit Number 8    Number of Visits 15    Date for PT Re-Evaluation 05/16/21    Progress Note Due on Visit 10    PT Start Time 0924    PT Stop Time 1007    PT Time Calculation (min) 43 min    Activity Tolerance Patient tolerated treatment well    Behavior During Therapy [facility] for tasks assessed/performed           Past Medical History:  Diagnosis Date  . Arthritis    fingers, hip  . Bilateral corneal abrasions    uses an ointment daily  . Heart murmur   . Hyperlipidemia   . Sleep apnea     Past Surgical History:  Procedure Laterality Date  . EYE SURGERY Bilateral 2010  . HERNIA REPAIR    . TONSILLECTOMY     as a child  . TOTAL HIP ARTHROPLASTY Left 03/14/2021   Procedure: LEFT TOTAL HIP ARTHROPLASTY ANTERIOR APPROACH;  Surgeon: Kathryne Hitch, MD;  Location: [facility];  Service: Orthopedics;  Laterality: Left;  . WISDOM TOOTH EXTRACTION      There were no vitals filed for this visit.   Subjective Assessment - 04/15/21 0927    Subjective Joel Brown states he is a little sore after yesterdays treatment    Patient Stated Goals go to [place] in June and be able to walk    Currently in Pain? Yes    Pain Score 2     Pain Orientation Left    Pain Descriptors / Indicators Dull              [facility] PT Assessment - 04/15/21 0001      Assessment   Medical Diagnosis Z96.642, left THA    Referring Provider (PT) Doneen Poisson, MD    Next MD Visit 04/24/2021                         [facility] Adult PT Treatment/Exercise - 04/15/21 0001      Knee/Hip Exercises: Stretches   Quad Stretch Left;3  reps;30 seconds    Quad Stretch Limitations prone with strap    Piriformis Stretch Both;30 seconds   in figure 4 position   Theme park manager Both;30 seconds      Knee/Hip Exercises: Aerobic   Recumbent Bike L5x7'      Knee/Hip Exercises: Standing   Side Lunges Both;3 sets;10 reps   alternating sides, VC for alignment   Other Standing Knee Exercises in line standing withrows using green band - not challenging for pt, facing side with FWD press using blue band    Other Standing Knee Exercises 10 reps curtsey lunges      Knee/Hip Exercises: Sidelying   Other Sidelying Knee/Hip Exercises 10 reps each, pilates FWD/BWD kicks, CW/CCW circles, and FWD/BWD arcs      Knee/Hip Exercises: Prone   Hip Extension Strengthening;Left;2 sets;15 reps   with bent knee                   PT Short Term Goals - 04/09/21 0919      PT SHORT TERM GOAL #1  Title Pt will be independent in his initial HEP.    Status On-going      PT SHORT TERM GOAL #2   Title pt will be able to perform SLR on the L LE x 10 with no extensor lag.    Status On-going             PT Long Term Goals - 04/07/21 1529      PT LONG TERM GOAL #1   Title pt will be independent in an advance HEP.    Status On-going      PT LONG TERM GOAL #2   Title Pt will be able to perform 5 time  sit to stand with no UE support with no pain in </= 10 seconds    Status On-going      PT LONG TERM GOAL #3   Title Pt will be able to amb with no device for >/= 1000 feet on community surfaces.    Status On-going      PT LONG TERM GOAL #4   Title Pt will be able to climb a flight of stairs with single hand rail.    Status On-going                 Plan - 04/15/21 1000    Clinical Impression Statement Joel Brown was able to perform more challenging exercises with good balance, he did fatigue through the core/hips.  Recovered quickly with rest and stretching.  Making great progress.    Rehab Potential Good    PT Frequency Other  (comment)    PT Duration 6 weeks    PT Treatment/Interventions ADLs/Self Care Home Management;Cryotherapy;Electrical Stimulation;Moist Heat;Gait training;Stair training;Functional mobility training;Therapeutic activities;Therapeutic exercise;Balance training;Neuromuscular re-education;Patient/family education;Manual techniques;Passive range of motion;Taping    PT Next Visit Plan assess response to new HEP, continue to work on proprioception.    PT Home Exercise Plan Access Code: [email or web address]    Consulted and Agree with Plan of Care Patient           Patient will benefit from skilled therapeutic intervention in order to improve the following deficits and impairments:  Pain,Difficulty walking,Decreased balance,Impaired flexibility,Decreased activity tolerance,Decreased range of motion,Decreased strength  Visit Diagnosis: Pain in left hip  Muscle weakness (generalized)  Difficulty in walking, not elsewhere classified     Problem List Patient Active Problem List   Diagnosis Date Noted  . Status post total replacement of left hip 03/14/2021  . Unilateral primary osteoarthritis, left hip 01/15/2021    Roderic Scarce PT  04/15/2021, 10:06 AM  [facility] Physical Therapy [address] [place], [place], [address] Phone: [phone]   Fax:  [phone]  Name: Joel Brown MRN: [id number] Date of Birth: [birth year]

## 2021-04-17 ENCOUNTER — Other Ambulatory Visit: Payer: Self-pay

## 2021-04-17 ENCOUNTER — Ambulatory Visit (INDEPENDENT_AMBULATORY_CARE_PROVIDER_SITE_OTHER): Payer: Medicare Other | Admitting: Physical Therapy

## 2021-04-17 DIAGNOSIS — R262 Difficulty in walking, not elsewhere classified: Secondary | ICD-10-CM

## 2021-04-17 DIAGNOSIS — M6281 Muscle weakness (generalized): Secondary | ICD-10-CM | POA: Diagnosis not present

## 2021-04-17 DIAGNOSIS — M25552 Pain in left hip: Secondary | ICD-10-CM | POA: Diagnosis not present

## 2021-04-17 NOTE — Therapy (Signed)
[facility] Physical Therapy [address] [place], [place], [address] Phone: [phone]   Fax:  [phone]  Physical Therapy Treatment  Patient Details  Name: Joel Brown MRN: [id number] Date of Birth: [birth year] Referring Provider (PT): Jean Rosenthal, MD   Encounter Date: 04/17/2021   PT End of Session - 04/17/21 1559    Visit Number 9    Number of Visits 15    Date for PT Re-Evaluation 05/16/21    Progress Note Due on Visit 10    PT Start Time 0313    PT Stop Time 0359    PT Time Calculation (min) 46 min    Activity Tolerance Patient tolerated treatment well    Behavior During Therapy [facility] for tasks assessed/performed           Past Medical History:  Diagnosis Date  . Arthritis    fingers, hip  . Bilateral corneal abrasions    uses an ointment daily  . Heart murmur   . Hyperlipidemia   . Sleep apnea     Past Surgical History:  Procedure Laterality Date  . EYE SURGERY Bilateral 2010  . HERNIA REPAIR    . TONSILLECTOMY     as a child  . TOTAL HIP ARTHROPLASTY Left 03/14/2021   Procedure: LEFT TOTAL HIP ARTHROPLASTY ANTERIOR APPROACH;  Surgeon: Mcarthur Rossetti, MD;  Location: [facility];  Service: Orthopedics;  Laterality: Left;  . WISDOM TOOTH EXTRACTION      There were no vitals filed for this visit.   Subjective Assessment - 04/17/21 1520    Subjective [name] relays a little sore and achy from some new exercises but this improved with NSAID.    Pertinent History left THA on 03/14/2021    How long can you sit comfortably? 90 minutes    How long can you walk comfortably? walking around the house without a problem    Diagnostic tests X-ray following surgery    Patient Stated Goals go to [place] in June and be able to walk    Pain Onset More than a month ago            [facility] Adult PT Treatment/Exercise - 04/17/21 0001      Knee/Hip Exercises: Stretches   Active Hamstring Stretch Left;3 reps;30 seconds    Active Hamstring  Stretch Limitations supine with strap      Knee/Hip Exercises: Aerobic   Recumbent Bike L4X10 min      Knee/Hip Exercises: Machines for Strengthening   Cybex Leg Press Bilat 125# 3X10, then SL Lt only 62# 3X10      Knee/Hip Exercises: Standing   SLS 15 sec X5    Other Standing Knee Exercises lateral walking and monster walking with green band around ankles 3 round trips at counter top no UE support    Other Standing Knee Exercises mini lunges X10 bilat with one UE support      Knee/Hip Exercises: Supine   Straight Leg Raises Left;2 sets;10 reps      Knee/Hip Exercises: Sidelying   Hip ABduction Left;10 reps    Other Sidelying Knee/Hip Exercises SL hip abd circles X10 ea                    PT Short Term Goals - 04/17/21 1602      PT SHORT TERM GOAL #1   Title Pt will be independent in his initial HEP.    Status Achieved      PT SHORT TERM GOAL #2  Title pt will be able to perform SLR on the L LE x 10 with no extensor lag.    Status Achieved             PT Long Term Goals - 04/07/21 1529      PT LONG TERM GOAL #1   Title pt will be independent in an advance HEP.    Status On-going      PT LONG TERM GOAL #2   Title Pt will be able to perform 5 time  sit to stand with no UE support with no pain in </= 10 seconds    Status On-going      PT LONG TERM GOAL #3   Title Pt will be able to amb with no device for >/= 1000 feet on community surfaces.    Status On-going      PT LONG TERM GOAL #4   Title Pt will be able to climb a flight of stairs with single hand rail.    Status On-going                 Plan - 04/17/21 1559    Clinical Impression Statement Continued with hip strength progression and balance today with good tolerance. He asked for updated HEP to add in all the new exercises we have been working on over the last 2 weeks. I added these into his HEP that he can access through the app with instructions to not do everything on the list all the  time but to mix a match about 5-6 to do a day. He has progressed well so far and has met his short term goals.    Personal Factors and Comorbidities Comorbidity 1    Comorbidities heart murmur, arthritis    Examination-Activity Limitations Squat;Stairs;Stand;Transfers;Lift    Examination-Participation Restrictions Community Activity;Other;Occupation    Rehab Potential Good    PT Frequency Other (comment)    PT Duration 6 weeks    PT Treatment/Interventions ADLs/Self Care Home Management;Cryotherapy;Electrical Stimulation;Moist Heat;Gait training;Stair training;Functional mobility training;Therapeutic activities;Therapeutic exercise;Balance training;Neuromuscular re-education;Patient/family education;Manual techniques;Passive range of motion;Taping    PT Next Visit Plan assess response to new HEP, continue to work on proprioception and strength    PT Home Exercise Plan Access Code: [email or web address]    Consulted and Agree with Plan of Care Patient           Patient will benefit from skilled therapeutic intervention in order to improve the following deficits and impairments:  Pain,Difficulty walking,Decreased balance,Impaired flexibility,Decreased activity tolerance,Decreased range of motion,Decreased strength  Visit Diagnosis: Pain in left hip  Muscle weakness (generalized)  Difficulty in walking, not elsewhere classified     Problem List Patient Active Problem List   Diagnosis Date Noted  . Status post total replacement of left hip 03/14/2021  . Unilateral primary osteoarthritis, left hip 01/15/2021    Silvestre Mesi 04/17/2021, 4:03 PM  [facility] Physical Therapy [address] [place], [place], [address] Phone: [phone]   Fax:  [phone]  Name: Joel Brown MRN: [id number] Date of Birth: 1951-02-18

## 2021-04-18 ENCOUNTER — Ambulatory Visit (INDEPENDENT_AMBULATORY_CARE_PROVIDER_SITE_OTHER): Payer: Medicare Other | Admitting: Podiatry

## 2021-04-18 ENCOUNTER — Encounter: Payer: Self-pay | Admitting: Podiatry

## 2021-04-18 DIAGNOSIS — L600 Ingrowing nail: Secondary | ICD-10-CM | POA: Diagnosis not present

## 2021-04-18 DIAGNOSIS — B351 Tinea unguium: Secondary | ICD-10-CM | POA: Diagnosis not present

## 2021-04-20 NOTE — Progress Notes (Signed)
Subjective:   Patient ID: Joel Brown, male   DOB: 70 y.o.   MRN: [id number]   HPI Patient presents with significant thickness deformity of his big toenails of both the with been present for years and states he did take oral antifungal in the past and that did not seem to help into the number of years.  He is hoping for other treatment does not smoke likes to be active   Review of Systems  All other systems reviewed and are negative.       Objective:  Physical Exam Vitals and nursing note reviewed.  Constitutional:      Appearance: He is well-developed.  Pulmonary:     Effort: Pulmonary effort is normal.  Musculoskeletal:        General: Normal range of motion.  Skin:    General: Skin is warm.  Neurological:     Mental Status: He is alert.     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  Spent a great deal of time going over why he is getting this and the fact that trauma cannot be reversed only nail removal could be done

## 2021-04-21 ENCOUNTER — Ambulatory Visit (INDEPENDENT_AMBULATORY_CARE_PROVIDER_SITE_OTHER): Payer: Medicare Other | Admitting: Physical Therapy

## 2021-04-21 ENCOUNTER — Encounter: Payer: Self-pay | Admitting: Physical Therapy

## 2021-04-21 ENCOUNTER — Other Ambulatory Visit: Payer: Self-pay

## 2021-04-21 DIAGNOSIS — M6281 Muscle weakness (generalized): Secondary | ICD-10-CM | POA: Diagnosis not present

## 2021-04-21 DIAGNOSIS — M25552 Pain in left hip: Secondary | ICD-10-CM

## 2021-04-21 DIAGNOSIS — R262 Difficulty in walking, not elsewhere classified: Secondary | ICD-10-CM | POA: Diagnosis not present

## 2021-04-21 NOTE — Therapy (Signed)
[facility] Physical Therapy [address] [place], [place], [address] Phone: [phone]   Fax:  [phone]  Physical Therapy Treatment/Recertification/Progress Note Progress Note Reporting Period 03/31/21 to 04/21/21  See note below for Objective Data and Assessment of Progress/Goals.       Patient Details  Name: Joel Brown MRN: [id number] Date of Birth: 05/10/1951 Referring Provider (PT): Jean Rosenthal, MD   Encounter Date: 04/21/2021   PT End of Session - 04/21/21 0926    Visit Number 10    Number of Visits 15    Date for PT Re-Evaluation 07/22/21    Progress Note Due on Visit 10    PT Start Time 0838    PT Stop Time 0920    PT Time Calculation (min) 42 min    Activity Tolerance Patient tolerated treatment well    Behavior During Therapy [facility] for tasks assessed/performed           Past Medical History:  Diagnosis Date  . Arthritis    fingers, hip  . Bilateral corneal abrasions    uses an ointment daily  . Heart murmur   . Hyperlipidemia   . Sleep apnea     Past Surgical History:  Procedure Laterality Date  . EYE SURGERY Bilateral 2010  . HERNIA REPAIR    . TONSILLECTOMY     as a child  . TOTAL HIP ARTHROPLASTY Left 03/14/2021   Procedure: LEFT TOTAL HIP ARTHROPLASTY ANTERIOR APPROACH;  Surgeon: Mcarthur Rossetti, MD;  Location: [facility];  Service: Orthopedics;  Laterality: Left;  . WISDOM TOOTH EXTRACTION      There were no vitals filed for this visit.   Subjective Assessment - 04/21/21 0840    Subjective doing well    Pertinent History left THA on 03/14/2021    How long can you sit comfortably? 90 minutes    How long can you walk comfortably? walking around the house without a problem    Diagnostic tests X-ray following surgery    Patient Stated Goals go to [place] in June and be able to walk    Currently in Pain? Yes    Pain Score --   0.5/10   Pain Location Hip    Pain Orientation Left    Pain Descriptors /  Indicators Dull    Pain Type Surgical pain    Pain Onset More than a month ago    Pain Frequency Intermittent    Aggravating Factors  prolonged standing and walking    Pain Relieving Factors resting, changing positions              [facility] PT Assessment - 04/21/21 0851      Assessment   Medical Diagnosis Z96.642, left THA    Referring Provider (PT) Jean Rosenthal, MD    Next MD Visit 04/24/2021      Observation/Other Assessments   Focus on Therapeutic Outcomes (FOTO)  62      Transfers   Five time sit to stand comments  9.03 sec with UE support; 10.88 sec without UE support      Ambulation/Gait   Stairs Yes    Stairs Assistance 7: Independent    Stair Management Technique No rails;Alternating pattern;Forwards    Number of Stairs 8    Height of Stairs 8    Gait Comments amb independently without difficulty > 1000'                         [facility] Adult  PT Treatment/Exercise - 04/21/21 0851      Knee/Hip Exercises: Stretches   Active Hamstring Stretch Left;3 reps;30 seconds    Active Hamstring Stretch Limitations supine with strap      Knee/Hip Exercises: Aerobic   Recumbent Bike L4X10 min      Knee/Hip Exercises: Standing   Other Standing Knee Exercises tandem walking with 10 sec hold with each step x 2 laps at counter      Knee/Hip Exercises: Supine   Straight Leg Raises Left;2 sets;10 reps                    PT Short Term Goals - 04/17/21 1602      PT SHORT TERM GOAL #1   Title Pt will be independent in his initial HEP.    Status Achieved      PT SHORT TERM GOAL #2   Title pt will be able to perform SLR on the L LE x 10 with no extensor lag.    Status Achieved             PT Long Term Goals - 04/21/21 0350      PT LONG TERM GOAL #1   Title pt will be independent in an advance HEP.    Status On-going    Target Date 05/16/21      PT LONG TERM GOAL #2   Title Pt will be able to perform 5 time  sit to stand with no UE  support with no pain in </= 10 seconds    Status Achieved      PT LONG TERM GOAL #3   Title Pt will be able to amb with no device for >/= 1000 feet on community surfaces.    Status Achieved      PT LONG TERM GOAL #4   Title Pt will be able to climb a flight of stairs with single hand rail.    Status Achieved                 Plan - 04/21/21 0929    Clinical Impression Statement Pt has met all LTGs except #1 as new HEP just issued prior session.  FOTO score is also near goal but no quite yet, and overall pt doing very well with PT.  He's interested in aquatic therapy as well, and discussed option of completing scheduled visits here through next week, then he will be traveling for ~ 10 days.  After he returns from vacation, recommend transition to aquatic therapy for a couple sessions.  Added aquatic therapy to POC and sent to MD for signature.    Personal Factors and Comorbidities Comorbidity 1    Comorbidities heart murmur, arthritis    Examination-Activity Limitations Squat;Stairs;Stand;Transfers;Lift    Examination-Participation Restrictions Community Activity;Other;Occupation    Rehab Potential Good    PT Frequency 2x / week    PT Duration 8 weeks    PT Treatment/Interventions ADLs/Self Care Home Management;Cryotherapy;Electrical Stimulation;Moist Heat;Gait training;Stair training;Functional mobility training;Therapeutic activities;Therapeutic exercise;Balance training;Neuromuscular re-education;Patient/family education;Manual techniques;Passive range of motion;Taping;Aquatic Therapy    PT Next Visit Plan assess response to new HEP, continue to work on proprioception and strength    PT Home Exercise Plan Access Code: [email or web address]    Consulted and Agree with Plan of Care Patient           Patient will benefit from skilled therapeutic intervention in order to improve the following deficits and impairments:  Pain,Difficulty walking,Decreased balance,Impaired flexibility,Decreased  activity tolerance,Decreased range of  motion,Decreased strength  Visit Diagnosis: Pain in left hip - Plan: PT plan of care cert/re-cert  Muscle weakness (generalized) - Plan: PT plan of care cert/re-cert  Difficulty in walking, not elsewhere classified - Plan: PT plan of care cert/re-cert     Problem List Patient Active Problem List   Diagnosis Date Noted  . Status post total replacement of left hip 03/14/2021  . Unilateral primary osteoarthritis, left hip 01/15/2021      Laureen Abrahams, PT, DPT 04/21/21 9:41 AM    [facility] Physical Therapy [address] [place], [place], [address] Phone: [phone]   Fax:  [phone]  Name: Joel Brown MRN: [id number] Date of Birth: May 29, 1951

## 2021-04-23 ENCOUNTER — Other Ambulatory Visit: Payer: Self-pay

## 2021-04-23 ENCOUNTER — Ambulatory Visit (INDEPENDENT_AMBULATORY_CARE_PROVIDER_SITE_OTHER): Payer: Medicare Other | Admitting: Orthopaedic Surgery

## 2021-04-23 ENCOUNTER — Encounter: Payer: Self-pay | Admitting: Orthopaedic Surgery

## 2021-04-23 ENCOUNTER — Ambulatory Visit (INDEPENDENT_AMBULATORY_CARE_PROVIDER_SITE_OTHER): Payer: Medicare Other | Admitting: Physical Therapy

## 2021-04-23 ENCOUNTER — Encounter: Payer: Self-pay | Admitting: Physical Therapy

## 2021-04-23 DIAGNOSIS — M6281 Muscle weakness (generalized): Secondary | ICD-10-CM

## 2021-04-23 DIAGNOSIS — M25552 Pain in left hip: Secondary | ICD-10-CM

## 2021-04-23 DIAGNOSIS — Z96642 Presence of left artificial hip joint: Secondary | ICD-10-CM

## 2021-04-23 DIAGNOSIS — R262 Difficulty in walking, not elsewhere classified: Secondary | ICD-10-CM

## 2021-04-23 NOTE — Therapy (Addendum)
[facility] Physical Therapy [address] [place], [place], [address] Phone: [phone]   Fax:  [phone]  Physical Therapy Treatment / Discharge   Patient Details  Name: Joel Brown MRN: [id number] Date of Birth: 1951-11-19 Referring Provider (PT): Jean Rosenthal, MD   Encounter Date: 04/23/2021   PT End of Session - 04/23/21 1408     Visit Number 11    Number of Visits 15    Date for PT Re-Evaluation 07/22/21    Progress Note Due on Visit 20    PT Start Time 1342    PT Stop Time 1405    PT Time Calculation (min) 23 min    Activity Tolerance Patient tolerated treatment well    Behavior During Therapy [facility] for tasks assessed/performed             Past Medical History:  Diagnosis Date   Arthritis    fingers, hip   Bilateral corneal abrasions    uses an ointment daily   Heart murmur    Hyperlipidemia    Sleep apnea     Past Surgical History:  Procedure Laterality Date   EYE SURGERY Bilateral 2010   HERNIA REPAIR     TONSILLECTOMY     as a child   TOTAL HIP ARTHROPLASTY Left 03/14/2021   Procedure: LEFT TOTAL HIP ARTHROPLASTY ANTERIOR APPROACH;  Surgeon: Mcarthur Rossetti, MD;  Location: [facility];  Service: Orthopedics;  Laterality: Left;   WISDOM TOOTH EXTRACTION      There were no vitals filed for this visit.   Subjective Assessment - 04/23/21 1340     Subjective good report from the MD    Pertinent History left THA on 03/14/2021    How long can you sit comfortably? 90 minutes    How long can you walk comfortably? walking around the house without a problem    Diagnostic tests X-ray following surgery    Patient Stated Goals go to [place] in June and be able to walk    Currently in Pain? No/denies    Pain Onset More than a month ago                               [facility] Adult PT Treatment/Exercise - 04/23/21 1348       Self-Care   Self-Care Other Self-Care Comments    Other Self-Care Comments   verbally reviewed HEP, pt very knowledgeable and has extensive HEP - no changes made today      Knee/Hip Exercises: Aerobic   Recumbent Bike L4X10 min                    PT Education - 04/23/21 1408     Education Details see self care    Person(s) Educated Patient    Methods Explanation    Comprehension Verbalized understanding              PT Short Term Goals - 04/17/21 1602       PT SHORT TERM GOAL #1   Title Pt will be independent in his initial HEP.    Status Achieved      PT SHORT TERM GOAL #2   Title pt will be able to perform SLR on the L LE x 10 with no extensor lag.    Status Achieved               PT Long Term Goals - 04/23/21 1408  PT LONG TERM GOAL #1   Title pt will be independent in an advance HEP.    Baseline 5/18: met for land PT, transferring to aquatic therapy and will get new HEP there    Status On-going    Target Date 07/22/21      PT LONG TERM GOAL #2   Title Pt will be able to perform 5 time  sit to stand with no UE support with no pain in </= 10 seconds    Status Achieved      PT LONG TERM GOAL #3   Title Pt will be able to amb with no device for >/= 1000 feet on community surfaces.    Status Achieved      PT LONG TERM GOAL #4   Title Pt will be able to climb a flight of stairs with single hand rail.    Status Achieved                   Plan - 04/23/21 1409     Clinical Impression Statement Pt has met all goals for PT at this facility.  He will transfer to [facility] for aquatic therapy so will leave goal #1 ongoing as he will get new HEP there.  Overall doing great with PT and anticipate he will d/c in a few PT sessions for aquatic therapy.    Personal Factors and Comorbidities Comorbidity 1    Comorbidities heart murmur, arthritis    Examination-Activity Limitations Squat;Stairs;Stand;Transfers;Lift    Examination-Participation Restrictions Community Activity;Other;Occupation    Rehab Potential Good     PT Frequency 2x / week    PT Duration 8 weeks    PT Treatment/Interventions ADLs/Self Care Home Management;Cryotherapy;Electrical Stimulation;Moist Heat;Gait training;Stair training;Functional mobility training;Therapeutic activities;Therapeutic exercise;Balance training;Neuromuscular re-education;Patient/family education;Manual techniques;Passive range of motion;Taping;Aquatic Therapy    PT Next Visit Plan transfer to [facility] for aquatic therapy    PT Home Exercise Plan Access Code: [email or web address]    Consulted and Agree with Plan of Care Patient             Patient will benefit from skilled therapeutic intervention in order to improve the following deficits and impairments:  Pain,Difficulty walking,Decreased balance,Impaired flexibility,Decreased activity tolerance,Decreased range of motion,Decreased strength  Visit Diagnosis: Pain in left hip  Muscle weakness (generalized)  Difficulty in walking, not elsewhere classified     Problem List Patient Active Problem List   Diagnosis Date Noted   Status post total replacement of left hip 03/14/2021   Unilateral primary osteoarthritis, left hip 01/15/2021     Laureen Abrahams, PT, DPT 04/23/21 2:11 PM  PHYSICAL THERAPY DISCHARGE SUMMARY  Visits from Start of Care: 11  Current functional level related to goals / functional outcomes: See note   Remaining deficits: See note   Education / Equipment: HEP   Patient agrees to discharge. Patient goals were partially met. Patient is being discharged due to not returning since the last visit.  Scot Jun, PT, DPT, OCS, ATC 05/28/21  3:46 PM      [facility] [address] [place], [place], [address] Phone: [phone]   Fax:  [phone]  Name: Nicolaas Savo MRN: [id number] Date of Birth: Aug 11, 1951

## 2021-04-23 NOTE — Progress Notes (Signed)
The patient is 6-week status post a left total hip arthroplasty.  He is doing well overall.  He does report weakness of his right hip flexors.  He is walking without assistive device.  In a few weeks he is traveling to [place].  He is 70 years old and active.  He is only taking occasional anti-inflammatories for pain.  He walks with only just a slight limp.  There are some weakness of his hip flexors on the left side but fluid and good range of motion of that hip.  There is only some slight stiffness.  From my standpoint I am fine with him proceeding with aquatic therapy to see if this will help loosen up his muscles.  There is still no restrictions for him except for no running.  From my standpoint, I do not need to see him back until 6 months from now with a standing low AP pelvis and lateral of his left operative hip.  If there is any issues before then he will let us know.  All questions and concerns were answered and addressed.

## 2021-04-28 ENCOUNTER — Encounter: Payer: Medicare Other | Admitting: Physical Therapy

## 2021-04-30 ENCOUNTER — Encounter: Payer: Medicare Other | Admitting: Physical Therapy

## 2021-05-29 ENCOUNTER — Other Ambulatory Visit: Payer: Self-pay

## 2021-05-29 ENCOUNTER — Encounter (HOSPITAL_BASED_OUTPATIENT_CLINIC_OR_DEPARTMENT_OTHER): Payer: Self-pay | Admitting: Physical Therapy

## 2021-05-29 ENCOUNTER — Ambulatory Visit (HOSPITAL_BASED_OUTPATIENT_CLINIC_OR_DEPARTMENT_OTHER): Payer: Medicare Other | Attending: Orthopaedic Surgery | Admitting: Physical Therapy

## 2021-05-29 DIAGNOSIS — R262 Difficulty in walking, not elsewhere classified: Secondary | ICD-10-CM | POA: Diagnosis present

## 2021-05-29 DIAGNOSIS — M25552 Pain in left hip: Secondary | ICD-10-CM | POA: Insufficient documentation

## 2021-05-29 DIAGNOSIS — M6281 Muscle weakness (generalized): Secondary | ICD-10-CM | POA: Diagnosis present

## 2021-05-29 NOTE — Therapy (Addendum)
[facility] [address] [place], [place], [address] Phone: [phone]   Fax:  [phone]  Physical Therapy Treatment  Patient Details  Name: Joel Brown MRN: [id number] Date of Birth: 1951-11-23 Referring Provider (PT): Jean Rosenthal, MD   Encounter Date: 05/29/2021     Past Medical History:  Diagnosis Date   Arthritis    fingers, hip   Bilateral corneal abrasions    uses an ointment daily   Heart murmur    Hyperlipidemia    Sleep apnea     Past Surgical History:  Procedure Laterality Date   EYE SURGERY Bilateral 2010   HERNIA REPAIR     TONSILLECTOMY     as a child   TOTAL HIP ARTHROPLASTY Left 03/14/2021   Procedure: LEFT TOTAL HIP ARTHROPLASTY ANTERIOR APPROACH;  Surgeon: Mcarthur Rossetti, MD;  Location: [facility];  Service: Orthopedics;  Laterality: Left;   WISDOM TOOTH EXTRACTION      There were no vitals filed for this visit.    Pt seen for aquatic therapy today.  Treatment took place in water 3.25-4.8 ft in depth at the [facility] pool. Temp of water was 91.  Pt entered/exited the pool via stairs (step to pattern independently with bilat rail.  Warm up forward, backward and side stepping cuing for increased step length speed and placement of UE for increased resistence.  Seated Stretching gastroc, hamstrings, Int/ext  hip rotators  Superman beginning with hand placement on bench in 3 ft then 2nd rung of ladder in 4.5 ft Stretching of LB and abdominals in extension, pulling forward knees to chest x 3 trials of 20 secs Strengthening: suspended by squoodle and foam ankle cuffs as well as therapist stabilizing at hips : knees to chest then rotation towards right then left shoulder (abdominal, serratus and erector spinae) x 10 reps each Barnabas Lister knife 2 x 5 reps  Standing Add/abd supported by wall, progressed to barbells 2 x 10 reps Kick board pushdowns 3 x 10 reps increasing speed,  kick board rotations x 10 reps each direction cuing for tightening of glutes, and abdominals throughout.  Added manual water perturbations.   Cardio Water walking 6 lengths using ankle buoy cuffs on stepping forward and backward cuing for increased speed and step length.      Pt requires buoyancy for support and to offload joints with strengthening exercises. Viscosity of the water is needed for resistance of strengthening; water current perturbations provides challenge to standing balance unsupported, requiring increased core activation.                              PT Short Term Goals - 04/17/21 1602       PT SHORT TERM GOAL #1   Title Pt will be independent in his initial HEP.    Status Achieved      PT SHORT TERM GOAL #2   Title pt will be able to perform SLR on the L LE x 10 with no extensor lag.    Status Achieved               PT Long Term Goals - 04/23/21 1408       PT LONG TERM GOAL #1   Title pt will be independent in an advance HEP.    Baseline 5/18: met for land PT, transferring to aquatic therapy and will get new HEP there    Status On-going  Target Date 07/22/21      PT LONG TERM GOAL #2   Title Pt will be able to perform 5 time  sit to stand with no UE support with no pain in </= 10 seconds    Status Achieved      PT LONG TERM GOAL #3   Title Pt will be able to amb with no device for >/= 1000 feet on community surfaces.    Status Achieved      PT LONG TERM GOAL #4   Title Pt will be able to climb a flight of stairs with single hand rail.    Status Achieved                    Patient will benefit from skilled therapeutic intervention in order to improve the following deficits and impairments:  Pain, Difficulty walking, Decreased balance, Impaired flexibility, Decreased activity tolerance, Decreased range of motion, Decreased strength  Visit Diagnosis: Pain in left hip  Muscle weakness  (generalized)  Difficulty in walking, not elsewhere classified  Clinical Assessment Pt reporting very little to no pain. Some left glute/hip weakness persisting after THR. Focus of treatment is on higher level core and hip strengthening. Completes without pain, voices benig challenged well gaining muscle fatigue due to adequete isolation of targeted muscle groups throughout session. Anticipate pt will not need extended services.  Problem List Patient Active Problem List   Diagnosis Date Noted   Status post total replacement of left hip 03/14/2021   Unilateral primary osteoarthritis, left hip 01/15/2021    Vedia Pereyra MPT 06/17/2021, 4:39 PM  [facility] [address] [place], [place], [address] Phone: [phone]   Fax:  [phone]  Name: Reese Stockman MRN: [id number] Date of Birth: February 03, 1951  Addended Stanton Kidney Tharon Aquas)  MPT 06/17/21

## 2021-06-05 ENCOUNTER — Ambulatory Visit (HOSPITAL_BASED_OUTPATIENT_CLINIC_OR_DEPARTMENT_OTHER): Payer: Medicare Other | Admitting: Physical Therapy

## 2021-06-24 ENCOUNTER — Encounter (HOSPITAL_BASED_OUTPATIENT_CLINIC_OR_DEPARTMENT_OTHER): Payer: Self-pay | Admitting: Physical Therapy

## 2021-06-24 ENCOUNTER — Other Ambulatory Visit: Payer: Self-pay

## 2021-06-24 ENCOUNTER — Ambulatory Visit (HOSPITAL_BASED_OUTPATIENT_CLINIC_OR_DEPARTMENT_OTHER): Payer: Medicare Other | Attending: Orthopaedic Surgery | Admitting: Physical Therapy

## 2021-06-24 DIAGNOSIS — M6281 Muscle weakness (generalized): Secondary | ICD-10-CM | POA: Diagnosis present

## 2021-06-24 DIAGNOSIS — M25552 Pain in left hip: Secondary | ICD-10-CM

## 2021-06-24 DIAGNOSIS — R262 Difficulty in walking, not elsewhere classified: Secondary | ICD-10-CM | POA: Diagnosis present

## 2021-06-24 NOTE — Therapy (Signed)
[facility] [address] [place], [place], [address] Phone: [phone]   Fax:  [phone]  Physical Therapy Treatment  Patient Details  Name: Joel Brown MRN: [id number] Date of Birth: [birth year] Referring Provider (PT): Jean Rosenthal, MD   Encounter Date: 06/24/2021   PT End of Session - 06/24/21 0912     Visit Number 13    Number of Visits 15    Date for PT Re-Evaluation 07/22/21    PT Start Time 0900    PT Stop Time 0945    PT Time Calculation (min) 45 min    Equipment Utilized During Treatment Other (comment)    Activity Tolerance Patient tolerated treatment well    Behavior During Therapy [facility] for tasks assessed/performed             Past Medical History:  Diagnosis Date   Arthritis    fingers, hip   Bilateral corneal abrasions    uses an ointment daily   Heart murmur    Hyperlipidemia    Sleep apnea     Past Surgical History:  Procedure Laterality Date   EYE SURGERY Bilateral 2010   HERNIA REPAIR     TONSILLECTOMY     as a child   TOTAL HIP ARTHROPLASTY Left 03/14/2021   Procedure: LEFT TOTAL HIP ARTHROPLASTY ANTERIOR APPROACH;  Surgeon: Mcarthur Rossetti, MD;  Location: [facility];  Service: Orthopedics;  Laterality: Left;   WISDOM TOOTH EXTRACTION      There were no vitals filed for this visit.   Subjective Assessment - 06/24/21 1101     Subjective "Just came back from 2 weeks in [place], no pain"    Pertinent History left THA on 03/14/2021    Currently in Pain? No/denies    Multiple Pain Sites No           Pt seen for aquatic therapy today.  Treatment took place in water 3.25-4.8 ft in depth at the [facility] pool. Temp of water was 91.  Pt entered/exited the pool via stairs step through pattern independently with bilat rail.  Standing Noodle kick downs 3 x 10 reps bilat hip flex and with hip in external rotation Hip flexor stretch using noodle 3 x30 sec f/b hip  flex strengthening 3 x 15 rep Hip add stretch 3 x 30 f/b strengthening 3 x 15 reps Worked with pt to attain positions indep with noodle as he will at [facility] Adduction/abduction 2 x 15 reps cuing for increased speed.  Water walking/running forward and back 4 widths each without rest period for aerobic capacity training.  Vertical suspension Noodle bicycling x 10 min. Movements include scissoring, as well as add/abd (strengthening and aerobic capacity training)  Pt requires buoyancy for support and to offload joints with strengthening exercises. Viscosity of the water is needed for resistance of strengthening; water current perturbations provides challenge to standing balance unsupported, requiring increased core activation.                             PT Education - 06/24/21 1101     Education Details HEP    Person(s) Educated Patient    Methods Explanation;Demonstration;Handout    Comprehension Verbalized understanding;Returned demonstration                        Plan - 06/24/21 0913     Clinical Impression Statement Have been gone for 2 weeks in  [place].  Have done very well.  Want to know what to do in pool when I go to [facility] which I am about to join.  Pt without any pain.  Reports minor dicomofrt occasionally in back. HEP laminated and given to pt.  Is ready for DC from aquatics    Personal Factors and Comorbidities Comorbidity 1    Stability/Clinical Decision Making Stable/Uncomplicated    Clinical Decision Making Low    Rehab Potential Excellent    PT Frequency 2x / week    PT Treatment/Interventions ADLs/Self Care Home Management;Cryotherapy;Electrical Stimulation;Moist Heat;Gait training;Stair training;Functional mobility training;Therapeutic activities;Therapeutic exercise;Balance training;Neuromuscular re-education;Patient/family education;Manual techniques;Passive range of motion;Taping;Aquatic Therapy             Patient will benefit  from skilled therapeutic intervention in order to improve the following deficits and impairments:  Pain, Difficulty walking, Decreased balance, Impaired flexibility, Decreased activity tolerance, Decreased range of motion, Decreased strength  Visit Diagnosis: Pain in left hip  Muscle weakness (generalized)  Difficulty in walking, not elsewhere classified     Problem List Patient Active Problem List   Diagnosis Date Noted   Status post total replacement of left hip 03/14/2021   Unilateral primary osteoarthritis, left hip 01/15/2021  PHYSICAL THERAPY DISCHARGE SUMMARY  Visits from Start of Care: 13  Current functional level related to goals / functional outcomes: Pt is independent with all functional mobility and ADL's    Remaining deficits: Some LB fatigue with over exertion   Education / Equipment: HEP, Disease process and management   Patient agrees to discharge. Patient goals were met. Patient is being discharged due to meeting the stated rehab goals.   Vedia Pereyra  MPT 06/24/2021, 11:09 AM  [facility] [address] [place], [place], [address] Phone: [phone]   Fax:  [phone]  Name: Joel Brown MRN: [id number] Date of Birth: 10/16/1951

## 2021-07-21 ENCOUNTER — Telehealth: Payer: Self-pay

## 2021-07-21 NOTE — Telephone Encounter (Signed)
Faxed note to provided fax number.

## 2021-07-21 NOTE — Telephone Encounter (Signed)
Dental office is requesting a form to be faxed stating that patient does not need to pre med prior to dental appointment.   S/p left THA 03/14/2021  Fax number: [phone]

## 2021-07-24 ENCOUNTER — Telehealth: Payer: Self-pay | Admitting: Neurology

## 2021-07-24 NOTE — Telephone Encounter (Signed)
Called the patient to reschedule Monday apt. I have a opening on Monday at 3 pm that I placed on hold for the patient to see if we could just move them a little later.

## 2021-07-27 ENCOUNTER — Encounter: Payer: Self-pay | Admitting: Neurology

## 2021-07-28 ENCOUNTER — Ambulatory Visit (INDEPENDENT_AMBULATORY_CARE_PROVIDER_SITE_OTHER): Payer: Medicare Other | Admitting: Neurology

## 2021-07-28 ENCOUNTER — Encounter: Payer: Self-pay | Admitting: Neurology

## 2021-07-28 ENCOUNTER — Institutional Professional Consult (permissible substitution): Payer: Medicare Other | Admitting: Neurology

## 2021-07-28 ENCOUNTER — Other Ambulatory Visit: Payer: Self-pay

## 2021-07-28 VITALS — BP 148/85 | HR 68 | Ht 73.5 in | Wt 224.5 lb

## 2021-07-28 DIAGNOSIS — Z9989 Dependence on other enabling machines and devices: Secondary | ICD-10-CM

## 2021-07-28 DIAGNOSIS — G4733 Obstructive sleep apnea (adult) (pediatric): Secondary | ICD-10-CM

## 2021-07-28 NOTE — Patient Instructions (Signed)

## 2021-07-28 NOTE — Progress Notes (Signed)
SLEEP MEDICINE CLINIC    Provider:  [name], MD  Primary Care Physician:  Charlane Ferretti, DO [address] [place] [address]     Referring Provider: Charlane Ferretti, Do [address] [place],  [place] [address]          Chief Complaint according to patient   Patient presents with:     New Patient (Initial Visit)     Paper referral from [facility], DO for OSA eval. Moved from [place] to [place] last November to be closer to daughter. Has current machine. Has travel cpap machine that he would like to learn on how to use. Brought old CPAP machine with him as well.       HISTORY OF PRESENT ILLNESS:  Werner Labella is a 70 y.o. year old White or Caucasian male patient seen here as a referral on 07/28/2021 from Dr Thornell Mule  for a Sleep consultation.   .  Chief concern according to patient : 07-28-2021-  moved here from [place] and was tested for OSA at [facility] 7 years ago. Has been on a CPAP and this is now over 102 years old.  He remembers that he had frequent nocturia, nocturnal bathroom breaks.  And then was apparently diagnosed with sleep apnea and an AHI of 21.0 the recent "" diagnostic study here was quoted by his [facility] provider on 10-04-2017 so I suspect that his sleep study is just 63-1/68 years old.  He felt worn out to titration that he could actually sleep better through the night and had no longer as many bathroom breaks.  Therapy data were collected through [facility] and showed that the 95th percentile pressure was 12.1 cmH2O airleak 95th percentile pressure 13.2 L/min, residual AHI was 9.4 of which 4.3 per central apneas and 3.9 were obstructive apneas.  This is surprising to me and he had an AutoSet which was was provided at factory settings.  After the visit in October 2018 his CPAP was restricted to 10 from 16 cm water pressure.    He was asked not to sleep in supine position and to avoid sleeping when drowsy.  To incorporate some weight loss management.   His BMI was 29 at the time.  I think this was the last available CPAP data.  Today I can look at his current device use he is a highly compliant CPAP user on 07-28-2021 he is 100% compliant by days of 97% compliant by hours with an average of 5 hours 58 minutes.  Minimum pressure is now 10 maximum pressure 16 cmH2O with 2 cm expiratory pressure relief the residual AHI is only 2.8 and he has only 0.7 central apneas and 0.9 obstructive apneas.  Air leak mid is high at the 95th percentile 35.8 L/min so we may have to work on his mask fit, his pressure is at the 95th percentile 13.2 cmH2O.   So the CPAP is 70 years old issued in August 2018.  Serial number is [id number] air sense 10 AutoSet.     Sleep relevant medical history:no  Nocturia/ no snoring, had a tonsillectomy as a child. Medical history was not filled: hip osteoarthritis,corneal tear.   Family medical /sleep history: no other family member on CPAP with OSA, insomnia, sleep walkers.    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 . Family includes 2 daughters , one living.  The patient currently works/ from home-  Tobacco use:  none .  ETOH use rare ,  Caffeine intake in form of Coffee( 2 cup daily) Soda( 5 / week) Tea ( /) or energy drinks.     Sleep habits are as follows: uses CPAP nightly  The patient's dinner time is between 6 PM. The patient goes to sleep in front of the Tv in den- bed at 11PM and continues to sleep for 7 hours, wakes for 1 bathroom breaks, the first time at 4.30 AM.   The preferred sleep position is sideways, with the support of 1-2 pillows.  Dreams are reportedly frequent.Marland Kitchen  7 AM is the usual rise time. The patient wakes up spontaneously. He reports not feeling refreshed or restored in AM, with symptoms such as dry mouth- when CPAP ran out of water.   Review of Systems: Out of a  complete 14 system review, the patient complains of only the following symptoms, and all other reviewed systems are negative.:   Abnormal ECHO, "heart murmur".   All On CPAP    How likely are you to doze in the following situations: 0 = not likely, 1 = slight chance, 2 = moderate chance, 3 = high chance   Sitting and Reading? Watching Television? Sitting inactive in a public place (theater or meeting)? As a passenger in a car for an hour without a break? Lying down in the afternoon when circumstances permit? Sitting and talking to someone? Sitting quietly after lunch without alcohol? In a car, while stopped for a few minutes in traffic?   Total = 10/ 24 points   FSS endorsed at 00/ 63 points.   Social History   Socioeconomic History   Marital status: Married    Spouse name: Candace   Number of children: 1   Years of education: BA   Highest education level: Not on file  Occupational History   Not on file  Tobacco Use   Smoking status: Never   Smokeless tobacco: Never  Vaping Use   Vaping Use: Never used  Substance and Sexual Activity   Alcohol use: Yes    Comment: 1/month   Drug use: Never   Sexual activity: Not on file  Other Topics Concern   Not on file  Social History Narrative   2-3 cups caffeine per day (soda/coffee)   Right handed   Social Determinants of Health   Financial Resource Strain: Not on file  Food Insecurity: Not on file  Transportation Needs: Not on file  Physical Activity: Not on file  Stress: Not on file  Social Connections: Not on file    History reviewed. No pertinent family history.  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     Glucosamine HCl 1500 MG TABS Take 3,000 mg by mouth daily. Dona     Multiple Vitamins-Minerals (MULTIVITAMIN WITH MINERALS) tablet Take 3 tablets by mouth daily. Package with a total of 6 pill   Omega 3 Co q 10/ phyto Probiotic     sodium chloride (MURO 128) 5 % ophthalmic ointment Place 1 application into both eyes at bedtime.     Sodium Hyaluronate, oral, (HYALURONIC ACID PO) Take 3 capsules by mouth daily. With supplement     No current facility-administered medications on file prior to visit.    Allergies  Allergen Reactions   Iodine Itching    IV IODINE    Penicillins Other (See Comments)    unknown   Latex Rash    Physical exam:  Today's Vitals   07/28/21 1514  BP: (!) 148/85  Pulse: 68  SpO2: 97%  Weight: 224 lb 8 oz (101.8 kg)  Height: 6' 1.5" (1.867 m)   Body mass index is 29.22 kg/m.   Wt Readings from Last 3 Encounters:  07/28/21 224 lb 8 oz (101.8 kg)  03/14/21 219 lb (99.3 kg)  03/06/21 217 lb (98.4 kg)     Ht Readings from Last 3 Encounters:  07/28/21 6' 1.5" (1.867 m)  03/14/21 6\' 1"  (1.854 m)  03/06/21 6\' 1"  (1.854 m)      General: The patient is awake, alert and appears not in acute distress. The patient is well groomed. Head: Normocephalic, atraumatic. Neck is supple. Mallampati 3,  neck circumference:17 inches . Nasal airflow  patent.  Retrognathia is not seen.  Dental status: biologica Cardiovascular:  Regular rate and cardiac rhythm by pulse,  without distended neck veins. Respiratory: Lungs are clear to auscultation.  Skin:  Without evidence of ankle edema, or rash. Trunk: The patient's posture  is erect.   Neurologic exam : The patient is awake and alert, oriented to place and time.   Memory subjective described as intact.  Attention span & concentration ability appears normal.  Speech is fluent,  without  dysarthria, dysphonia or aphasia.  Mood and affect are appropriate.   Cranial nerves: no loss of smell or taste reported  Pupils are equal and briskly reactive to light. Funduscopic exam deferred. .  Extraocular movements in vertical and horizontal planes were  with a left sided  nystagmus. No Diplopia. Visual fields by finger perimetry are intact. Hearing was intact to soft voice and finger rubbing.    Facial sensation intact to fine touch.  Facial motor strength is symmetric and tongue and uvula move midline.  Neck ROM : rotation, tilt and flexion extension were normal for age and shoulder shrug was symmetrical.    Motor exam:  Symmetric bulk, tone and ROM.   Normal tone without cog- wheeling, symmetric grip strength .   Sensory:  Fine touch, pinprick and vibration were tested  and  normal.  Proprioception tested in the upper extremities was normal.   Coordination: Rapid alternating movements in the fingers/hands were of normal speed.  The Finger-to-nose maneuver was intact without evidence of ataxia, dysmetria or tremor.   Gait and station: Patient could rise unassisted from a seated position, walked without assistive device.  Stance is of normal width/ base and the patient turned with 3 steps.  Toe and heel walk were deferred.  Deep tendon reflexes: in the  upper and lower extremities are symmetric and intact.  Lower ext- strong  blateral patella reflexes.  Babinski response was deferred      After spending a total time of  50  minutes face to face and time for physical and neurologic examination,  additional review of laboratory studies,  personal review of imaging studies, reports and results of other testing and review of referral information / records as far as  provided in visit, I have established the following assessments:  1) CPAP - user for 4 years - may be 5 - no dates available, will ask local DME to date the machine.   2) a heart murmur was recently discovered.   3) if machine is over 18 years old, will retest,    My Plan is to proceed with:  1) HST for baseline -  2) new supplies for CPAP, no changes in setting.  3)  Rv once a year-   I would like to thank Charlane Ferretti, Do [address] [place],  [place] [address] for allowing me to meet with and to take care of this pleasant patient.   In short, Avari Gelles is presenting with OSA on CPAP and is happy with the result of therapy , he relocated here to [facility] form [place], nasal mask. N 20.  I plan to follow up either personally or through our NP within 6 month.   CC: I will share my notes with PCP. Marland Kitchen  Electronically signed by: [name], MD 07/28/2021 3:36 PM  [facility] and [facility] Sleep Board certified by The [employer] of Sleep Medicine and Diplomate of the [employer] of Sleep Medicine. Board certified In Neurology through the ABPN, Fellow of the [employer] of Neurology. Medical Director of [employer].

## 2021-08-25 ENCOUNTER — Ambulatory Visit (INDEPENDENT_AMBULATORY_CARE_PROVIDER_SITE_OTHER): Payer: Medicare Other | Admitting: Neurology

## 2021-08-25 DIAGNOSIS — G4733 Obstructive sleep apnea (adult) (pediatric): Secondary | ICD-10-CM

## 2021-08-25 DIAGNOSIS — Z9989 Dependence on other enabling machines and devices: Secondary | ICD-10-CM

## 2021-09-01 NOTE — Progress Notes (Signed)
[facility]   HOME SLEEP TEST REPORT ( by Watch PAT)   STUDY DATE:  between 08-25-2021 and 09-01-2021 DOB: 06/15/1951  MRN: [id number]   ORDERING CLINICIAN: [name], MD  REFERRING CLINICIAN: [name] , DO    CLINICAL INFORMATION/HISTORY: Joel Brown was seen on 07-28-2021 upon referral by his primary care physician.  He moved recently from [place] to [place] to be closer to his daughter.  He has a CPAP machine and has a travel CPAP machine that he would like to learn how to use.  His sleep testing took place at [facility] about 4.5 years ago and his CPAP had been prescribed right after.  He remembers that prior to CPAP therapy he had frequent nocturia, his AHI was 21/h in his sleep test.   He was placed on an auto titration device and noted immediately that his sleep had improved.  In his follow-up visit 2018 with his [facility] physicians a residual AHI was established at 9.4/h which is still very high and 4.3/h of these very considered central apneas.  His autotitrator had been on factory settings 4 through 20 cmH2O and after the visit in October 2018 his settings were restricted to 10 - 16 cm water pressure.   His current device download in the clinic showed a highly compliant patient 97% compliance by hours with an average of 5 hours 58 minutes of daily use.  EPR is set at 2 cmH2O, his residual AHI is now only 2.8/h almost equal divided between central and obstructive apneas.  His mask seems to leak quite a bit his 95th percentile pressure is 13.2 cmH2O.  His machine is now a little over 4 years old.     Epworth sleepiness score: 10/24.   BMI: 29.8 kg/m   Neck Circumference: 17"   FINDINGS:   Sleep Summary:   Total Recording Time (hours, min):     The total recording time for this home sleep test device was 7 hours and 8 minutes of which 5 hours and 40 minutes were total sleep time.  The device calculated a REM sleep proportion of 30%.                                 Respiratory Indices: The calculated apnea-hypopnea index was 17.5/h during REM sleep 14.7 and in non-REM sleep 18.7/h.  There was a strong positional component noted as the supine sleep AHI was 29.5 and the nonsupine 9.5/h.  Snoring was average with a mean volume of 40 dB and snoring was only present for about 6% of the sleep time.                                                                    Oxygen Saturation Statistics:   O2 Saturation Range (%): Oxygen saturation ranged from a nadir of 87% to a maximum of 99% with a mean oxygen saturation of 93%.  Hypoxia was not present.  There was no minutes of sleep time under 89% saturation.  Pulse rate varied between 48 bpm and 97 bpm with a mean heart rate of 64 bpm.  Please note that this device can only give a heart rate   only give a heart rate but not heart rhythm data.                                         IMPRESSION:  This HST confirms the presence of mild to moderate sleep apnea and no significant central component was seen here in the baseline evaluation.  There is intermittent bradycardia present but no hypoxia.     RECOMMENDATION: Based on these data I feel that the patient should avoid supine sleep, and continue to use CPAP under the settings of 10-16 cm water pressure, 2 cm EPR.    I would be happy to order new supplies for his CPAP until it is 68 years old and it can be replaced.   We will do a revisit once a year after that.   We will ask a local durable medical equipment company to follow-up.    INTERPRETING PHYSICIAN:   [name], MD   Medical Director of [facility] Sleep at [facility].

## 2021-09-14 NOTE — Addendum Note (Signed)
Addended by: [name] on: 09/14/2021 08:49 PM   Modules accepted: Orders

## 2021-09-14 NOTE — Progress Notes (Signed)
Patient's machine is not yet 70 years old and we will order supplies in the meantime but the patient stays on his current machine, replacement will be possible in August 2023. REFERRING CLINICIAN:[name] , DO   10 - 16 cm water pressure.   His current device download in the clinic showed a highly compliant patient 97% compliance by hours with an average of 5 hours 58 minutes of daily use.  EPR is set at 2 cmH2O, his residual AHI is now only 2.8/h almost equal divided between central and obstructive apneas.  His mask seems to leak quite a bit his 95th percentile pressure is 13.2 cmH2O.  His machine is now a little over 7 years old.  Respiratory Indices: The calculated apnea-hypopnea index was 17.5/h during REM sleep 14.7 and in non-REM sleep 18.7/h.  There was a strong positional component noted as the supine sleep AHI was 29.5 and the nonsupine 9.5/h.  Snoring was average with a mean volume of 40 dB and snoring was only present for about 6% of the sleep time.    IMPRESSION:  This HST confirms the presence of mild to moderate sleep apnea and no significant central component was seen here in the baseline evaluation.  There is intermittent bradycardia present but no hypoxia.    RECOMMENDATION: Based on these data I feel that the patient should avoid supine sleep,  and continue to use CPAP under the settings of 10-16 cm water pressure, 2 cm EPR.    I would be happy to order new supplies for his CPAP until it is 70 years old and it can be replaced.   We will do a revisit once a year after that.   We will ask a local durable medical equipment company to follow-up.   INTERPRETING PHYSICIAN:   [name], MD   Medical Director of [facility] Sleep at [facility].

## 2021-09-14 NOTE — Procedures (Signed)
[facility]   HOME SLEEP TEST REPORT ( by Watch PAT)   STUDY DATE:  between 08-25-2021 and 09-01-2021 DOB: Jul 07, 1951  MRN: [id number]   ORDERING CLINICIAN: [name], MD  REFERRING CLINICIAN: Charlane Ferretti , DO    CLINICAL INFORMATION/HISTORY: Joel Brown was seen on 07-28-2021 upon referral by his primary care physician.  He moved recently from [place] to [place] to be closer to his daughter.  He has a CPAP machine and has a travel CPAP machine that he would like to learn how to use.  His sleep testing took place at [facility] about 4.5 years ago and his CPAP had been prescribed right after.  He remembers that prior to CPAP therapy he had frequent nocturia, his AHI was 21/h in his sleep test.   He was placed on an auto titration device and noted immediately that his sleep had improved.  In his follow-up visit 2018 with his [facility] physicians a residual AHI was established at 9.4/h which is still very high and 4.3/h of these very considered central apneas.  His autotitrator had been on factory settings 4 through 20 cmH2O and after the visit in October 2018 his settings were restricted to 10 - 16 cm water pressure.   His current device download in the clinic showed a highly compliant patient 97% compliance by hours with an average of 5 hours 58 minutes of daily use.  EPR is set at 2 cmH2O, his residual AHI is now only 2.8/h almost equal divided between central and obstructive apneas.  His mask seems to leak quite a bit his 95th percentile pressure is 13.2 cmH2O.  His machine is now a little over 68 years old.     Epworth sleepiness score: 10/24.   BMI: 29.8 kg/m   Neck Circumference: 17"   FINDINGS:   Sleep Summary:   Total Recording Time (hours, min):     The total recording time for this home sleep test device was 7 hours and 8 minutes of which 5 hours and 40 minutes were total sleep time.  The device calculated a REM sleep proportion of 30%.                                 Respiratory Indices: The calculated apnea-hypopnea index was 17.5/h during REM sleep 14.7 and in non-REM sleep 18.7/h.  There was a strong positional component noted as the supine sleep AHI was 29.5 and the nonsupine 9.5/h.  Snoring was average with a mean volume of 40 dB and snoring was only present for about 6% of the sleep time.                                                                    Oxygen Saturation Statistics:   O2 Saturation Range (%): Oxygen saturation ranged from a nadir of 87% to a maximum of 99% with a mean oxygen saturation of 93%.  Hypoxia was not present.  There was no minutes of sleep time under 89% saturation.  Pulse rate varied between 48 bpm and 97 bpm with a mean heart rate of 64 bpm.  Please note that this device can only give a heart rate  but not heart rhythm data.                                         IMPRESSION:  This HST confirms the presence of mild to moderate sleep apnea and no significant central component was seen here in the baseline evaluation.  There is intermittent bradycardia present but no hypoxia.     RECOMMENDATION: Based on these data I feel that the patient should avoid supine sleep, and continue to use CPAP under the settings of 10-16 cm water pressure, 2 cm EPR.    I would be happy to order new supplies for his CPAP until it is 68 years old and it can be replaced.   We will do a revisit once a year after that.   We will ask a local durable medical equipment company to follow-up.    INTERPRETING PHYSICIAN:   [name], MD   Medical Director of [facility] Sleep at [facility].

## 2021-09-16 ENCOUNTER — Telehealth: Payer: Self-pay | Admitting: Neurology

## 2021-09-16 ENCOUNTER — Other Ambulatory Visit: Payer: Self-pay | Admitting: Neurology

## 2021-09-16 DIAGNOSIS — Z9989 Dependence on other enabling machines and devices: Secondary | ICD-10-CM

## 2021-09-16 DIAGNOSIS — G4733 Obstructive sleep apnea (adult) (pediatric): Secondary | ICD-10-CM

## 2021-09-16 NOTE — Telephone Encounter (Signed)
I called pt. I advised pt that Dr. Vickey Huger reviewed their sleep study results and found that pt has mild to moderate OSA. Dr Dohmeier recommends that pt continues CPAP. I reviewed PAP compliance expectations with the pt. Pt is already established with a CPAP through [facility].He is under the impression that he is not eligible for a new machine until aug 2023. Advised I will send the updated order to [facility] for him to get supplies in the meantime. Pt verbalized understanding of results. Pt had no questions at this time but was encouraged to call back if questions arise. I have sent the order to [facility] and have received confirmation that they have received the order. I provided the patient with the local number to contact them.  If pt finds out he is eligible for a new machine we can send an order for him and make sure he follows up within 31 to 90 days from the date he gets his new machine.  If he is not due until August of next year as suspected, we can schedule a follow-up visit around July of next year so that way we can document compliance and order the machine at that time. Pt verbalized understanding.

## 2021-09-16 NOTE — Telephone Encounter (Signed)
-----   Message from [name], MD sent at 09/14/2021  8:49 PM EDT ----- Patient's machine is not yet 68 years old and we will order supplies in the meantime but the patient stays on his current machine, replacement will be possible in August 2023. REFERRING CLINICIAN:[name] , DO   10 - 16 cm water pressure.   His current device download in the clinic showed a highly compliant patient 97% compliance by hours with an average of 5 hours 58 minutes of daily use.  EPR is set at 2 cmH2O, his residual AHI is now only 2.8/h almost equal divided between central and obstructive apneas.  His mask seems to leak quite a bit his 95th percentile pressure is 13.2 cmH2O.  His machine is now a little over 83 years old.  Respiratory Indices: The calculated apnea-hypopnea index was 17.5/h during REM sleep 14.7 and in non-REM sleep 18.7/h.  There was a strong positional component noted as the supine sleep AHI was 29.5 and the nonsupine 9.5/h.  Snoring was average with a mean volume of 40 dB and snoring was only present for about 6% of the sleep time.    IMPRESSION:  This HST confirms the presence of mild to moderate sleep apnea and no significant central component was seen here in the baseline evaluation.  There is intermittent bradycardia present but no hypoxia.    RECOMMENDATION: Based on these data I feel that the patient should avoid supine sleep,  and continue to use CPAP under the settings of 10-16 cm water pressure, 2 cm EPR.    I would be happy to order new supplies for his CPAP until it is 70 years old and it can be replaced.   We will do a revisit once a year after that.   We will ask a local durable medical equipment company to follow-up.   INTERPRETING PHYSICIAN:   [name], MD   Medical Director of [facility] Sleep at [facility].

## 2021-09-30 ENCOUNTER — Other Ambulatory Visit (HOSPITAL_BASED_OUTPATIENT_CLINIC_OR_DEPARTMENT_OTHER): Payer: Self-pay

## 2021-09-30 ENCOUNTER — Ambulatory Visit: Payer: Medicare Other | Attending: Internal Medicine

## 2021-09-30 ENCOUNTER — Other Ambulatory Visit (HOSPITAL_COMMUNITY): Payer: Self-pay

## 2021-09-30 DIAGNOSIS — Z23 Encounter for immunization: Secondary | ICD-10-CM

## 2021-09-30 MED ORDER — PFIZER COVID-19 VAC BIVALENT 30 MCG/0.3ML IM SUSP
INTRAMUSCULAR | 0 refills | Status: AC
  Filled 2021-09-30: qty 0.3, 1d supply, fill #0

## 2021-09-30 MED ORDER — INFLUENZA VAC A&B SA ADJ QUAD 0.5 ML IM PRSY
PREFILLED_SYRINGE | INTRAMUSCULAR | 0 refills | Status: AC
  Filled 2021-09-30 (×2): qty 0.5, 1d supply, fill #0

## 2021-09-30 NOTE — Progress Notes (Signed)
   Covid-19 Vaccination Clinic  Name:  Zailen Albarran    MRN: [id number] DOB: 02/27/1951  09/30/2021  Mr. Laser was observed post Covid-19 immunization for 15 minutes without incident. He was provided with Vaccine Information Sheet and instruction to access the V-Safe system.   Mr. Torrez was instructed to call 911 with any severe reactions post vaccine: Difficulty breathing  Swelling of face and throat  A fast heartbeat  A bad rash all over body  Dizziness and weakness   Immunizations Administered     Name Date Dose VIS Date Route   Moderna Covid-19 vaccine Bivalent Booster 09/30/2021 12:17 PM 0.5 mL 07/19/2021 Intramuscular   Manufacturer: Moderna   Lot: [id number]   NDC: 63893-734-28

## 2021-10-22 ENCOUNTER — Encounter: Payer: Self-pay | Admitting: Orthopaedic Surgery

## 2021-10-22 ENCOUNTER — Other Ambulatory Visit: Payer: Self-pay

## 2021-10-22 ENCOUNTER — Ambulatory Visit: Payer: Medicare Other

## 2021-10-22 ENCOUNTER — Ambulatory Visit (INDEPENDENT_AMBULATORY_CARE_PROVIDER_SITE_OTHER): Payer: Medicare Other | Admitting: Orthopaedic Surgery

## 2021-10-22 DIAGNOSIS — Z96642 Presence of left artificial hip joint: Secondary | ICD-10-CM

## 2021-10-22 DIAGNOSIS — M25552 Pain in left hip: Secondary | ICD-10-CM

## 2021-10-22 NOTE — Progress Notes (Signed)
The patient is a 70 year old gentleman who is now 6 months status post a left total hip arthroplasty.  He says he is doing well and reports good range of motion and strength.  He has played a little bit of pickleball.  He still has a little numbness but he said that is improving.  His left operative hip moves smoothly and fluidly.  His leg lengths are equal.  He is walking with a normal gait.  An AP pelvis and lateral of the left hip shows a well-seated total hip arthroplasty with no complicating features.  This point follow-up for the left hip can be as needed.  We talked in length in detail about the things for the hip that we need to bring him back.  He has no restrictions.  If there are any issues he knows to let us know.

## 2022-06-18 IMAGING — RF DG HIP (WITH PELVIS) OPERATIVE*L*
1 series · 8 of 8 positions shown · non-contrast
Comparison: None.

CLINICAL DATA: Left hip replacement.

EXAM:
OPERATIVE LEFT HIP (WITH PELVIS IF PERFORMED)
TECHNIQUE: Fluoroscopic spot image(s) were submitted for interpretation
post-operatively.

[Series 1: unknown protocol · 0.20mm/px · 8 of 8 slices shown]
[im 1/8]
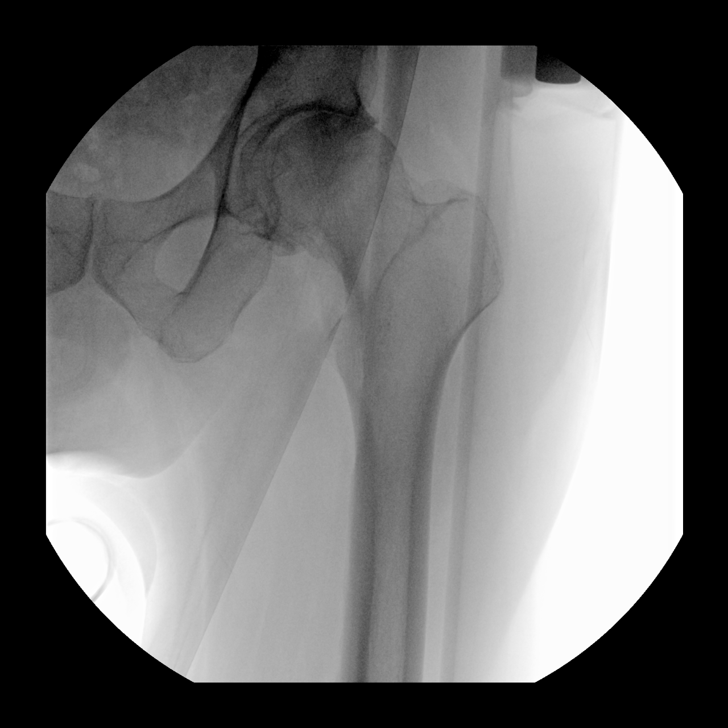
[im 2/8]
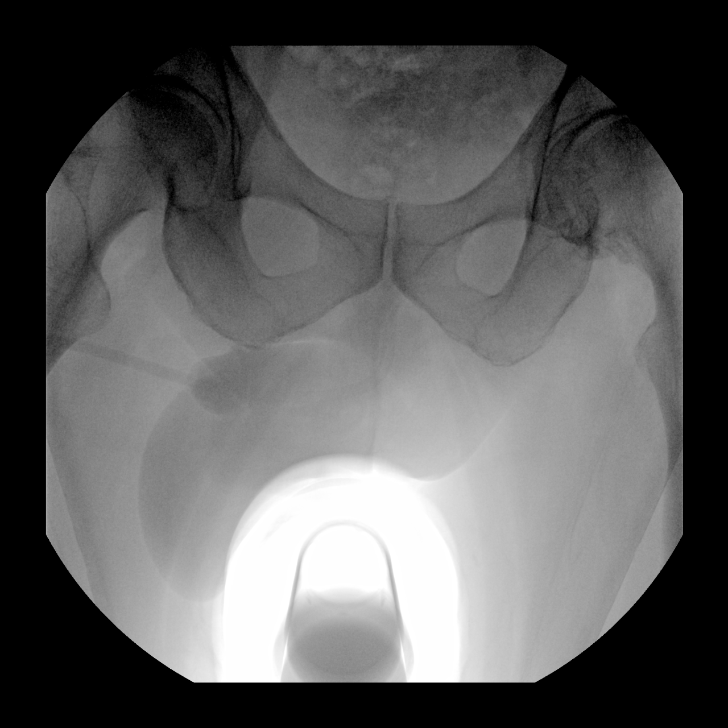
[im 3/8]
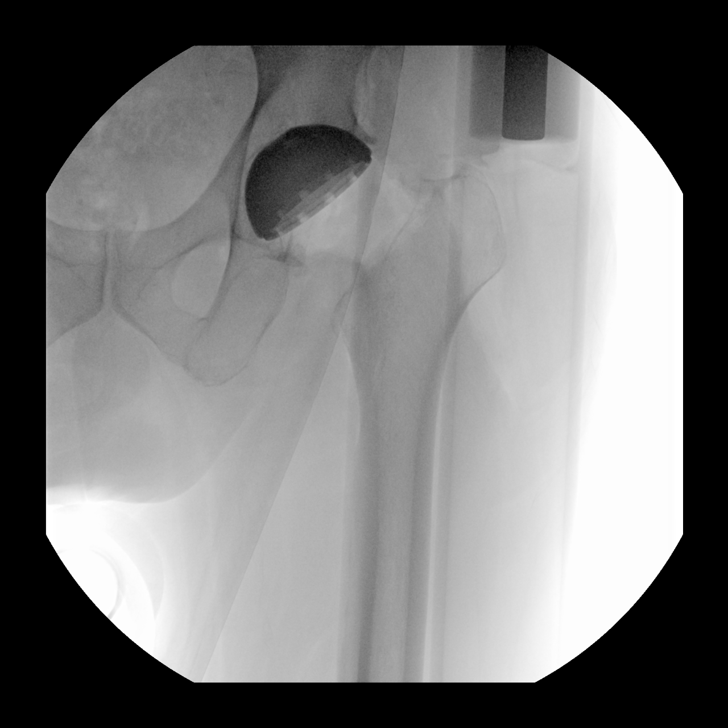
[im 4/8]
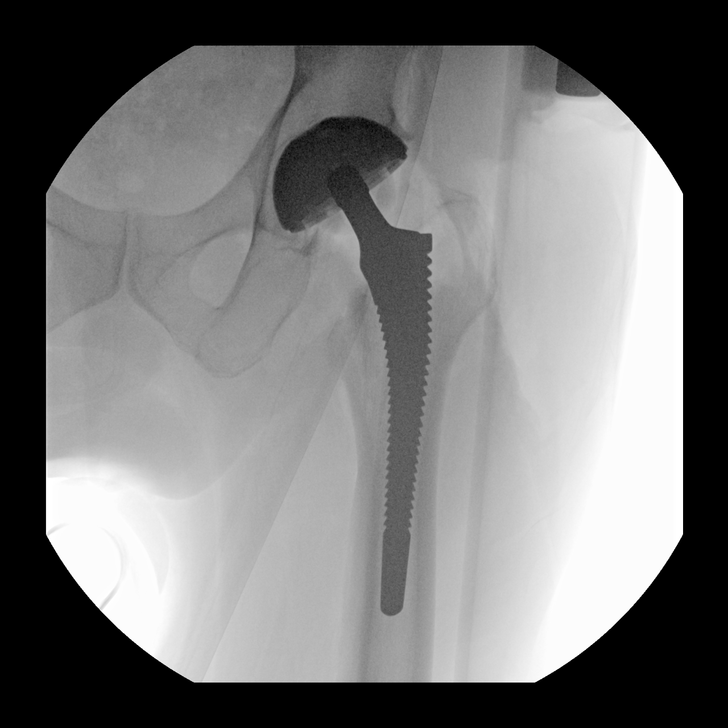
[im 5/8]
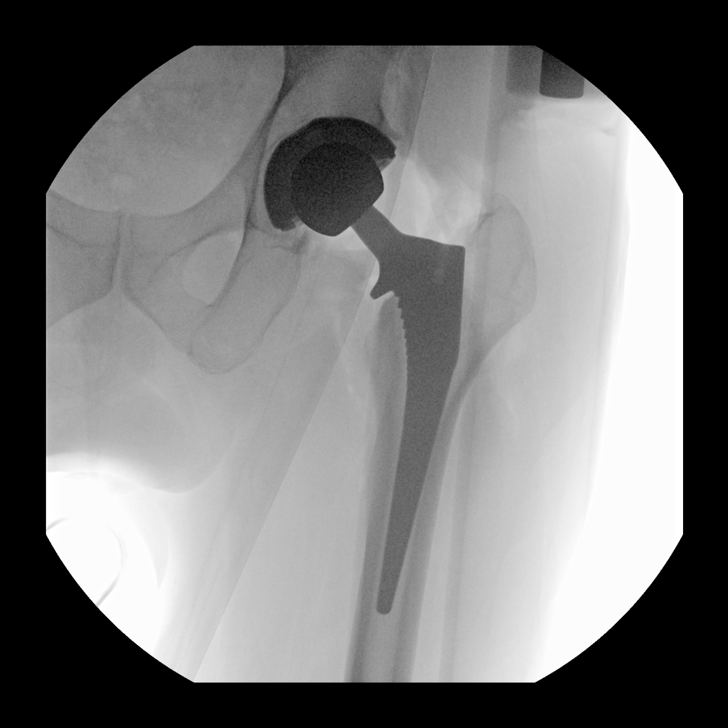
[im 6/8]
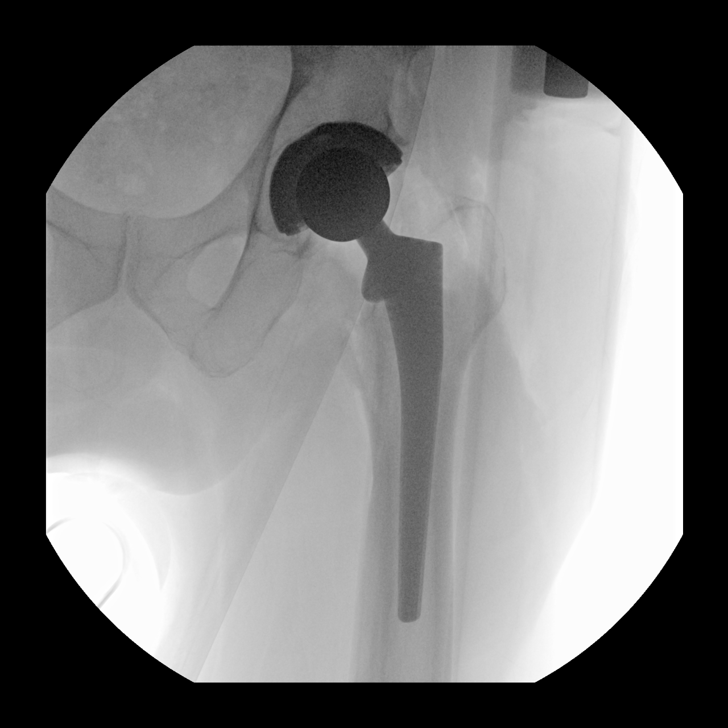
[im 7/8]
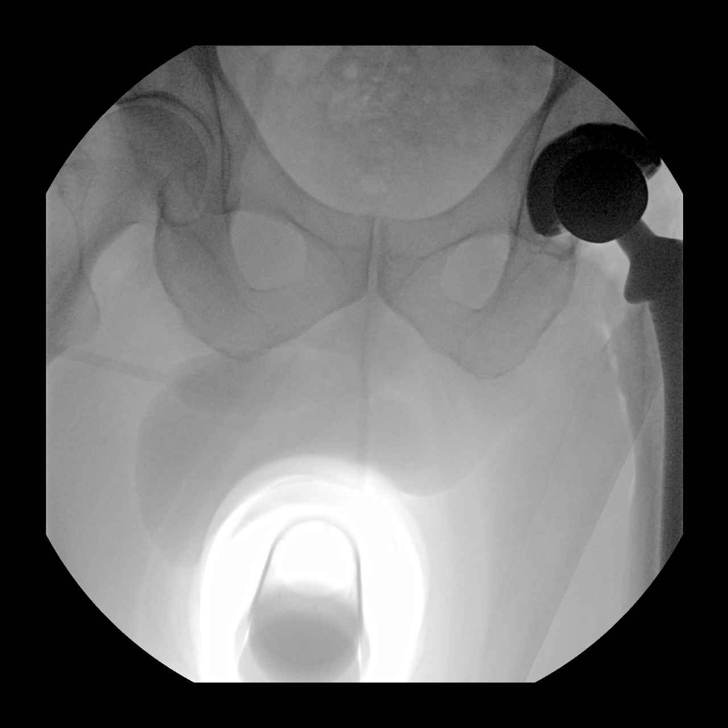
[im 8/8]
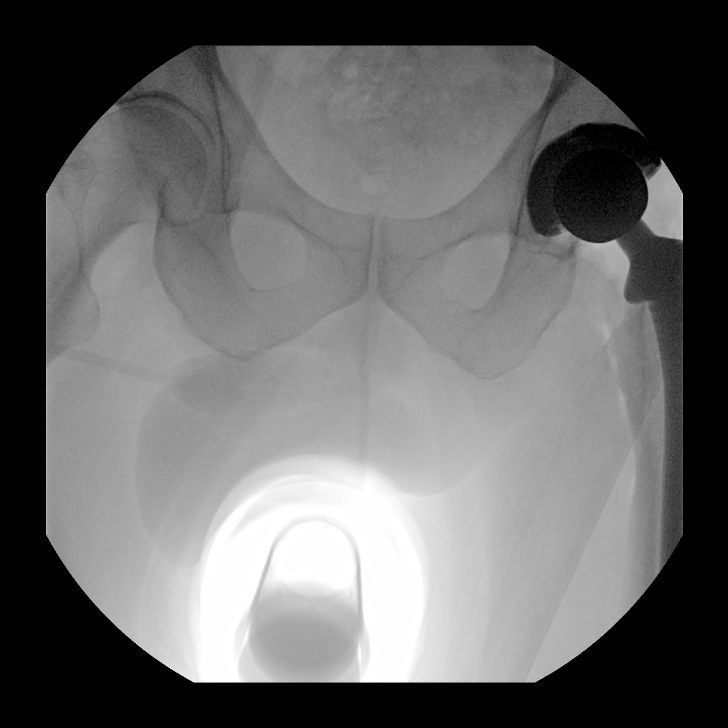

[8 of 8 positions shown; findings below may reference images not displayed]

FINDINGS: Seven fluoroscopic spot views of the pelvis and left hip obtained
during left hip arthroplasty. Arthroplasty in expected alignment.
Total fluoroscopy time 25 seconds. Total dose 3.27 mGy.
IMPRESSION: Procedural fluoroscopy during left hip arthroplasty.

## 2022-06-18 IMAGING — RF DG C-ARM 1-60 MIN-NO REPORT
1 series · 8 of 8 positions shown · non-contrast
Comparison: None.

CLINICAL DATA: Left hip replacement.

EXAM:
OPERATIVE LEFT HIP (WITH PELVIS IF PERFORMED)
TECHNIQUE: Fluoroscopic spot image(s) were submitted for interpretation
post-operatively.

[Series 1: unknown protocol · 0.20mm/px · 8 of 8 slices shown]
[im 1/8]
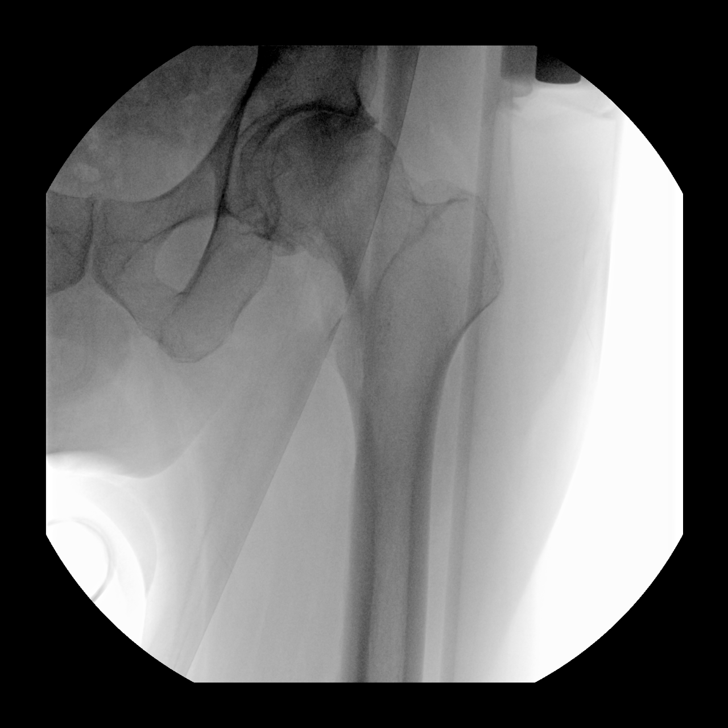
[im 2/8]
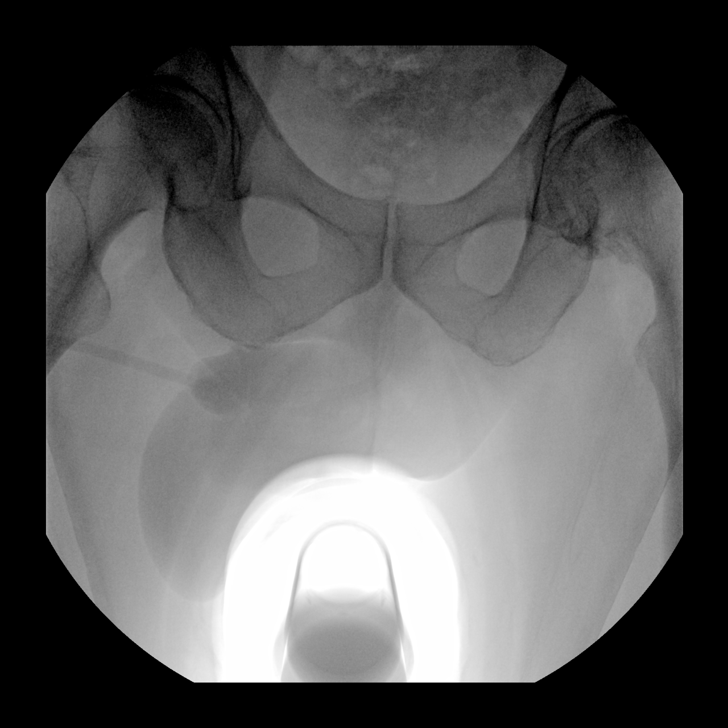
[im 3/8]
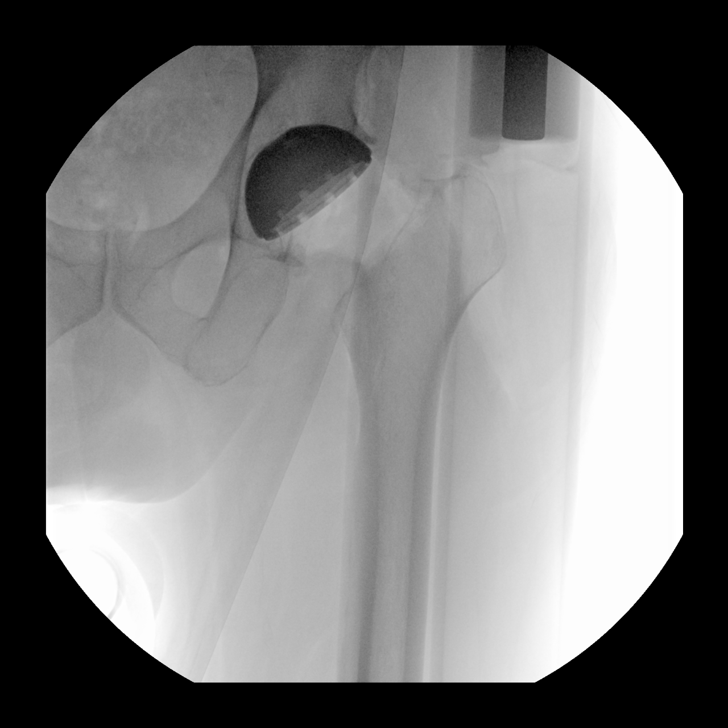
[im 4/8]
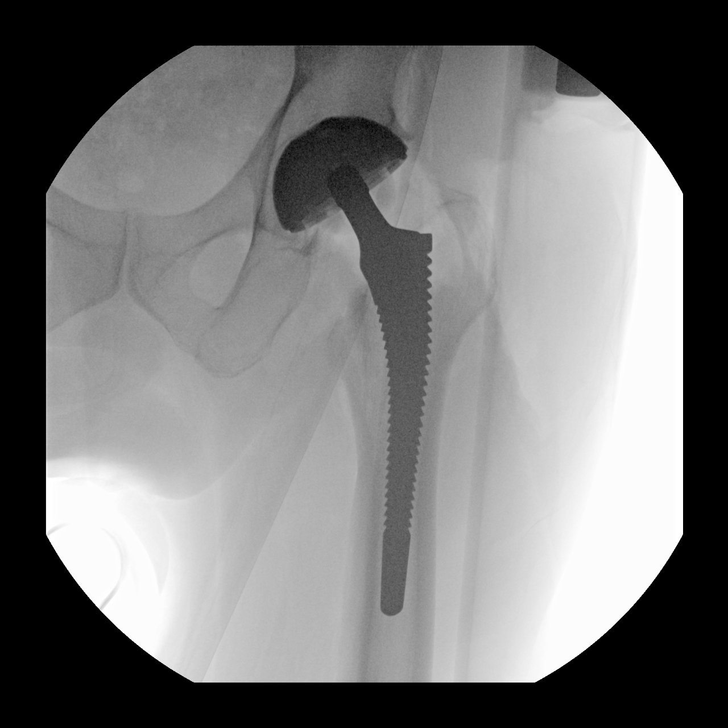
[im 5/8]
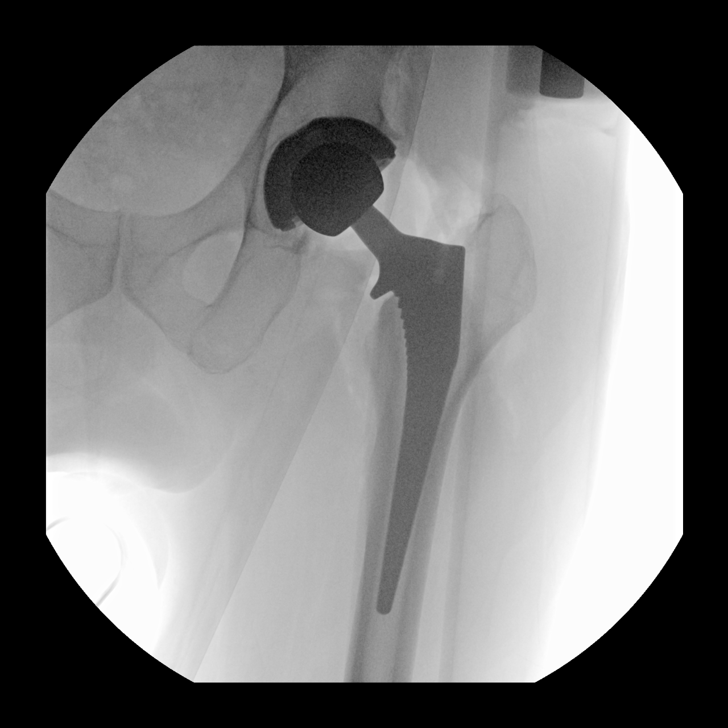
[im 6/8]
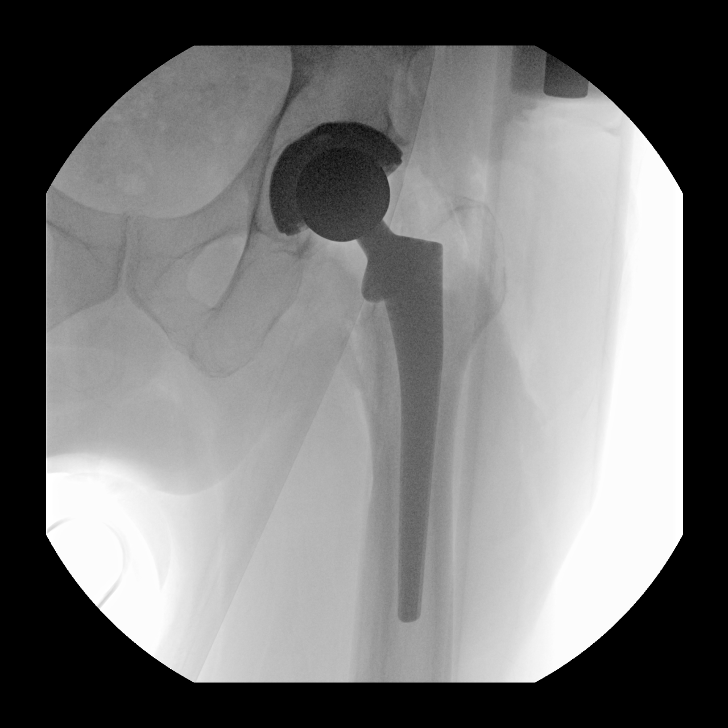
[im 7/8]
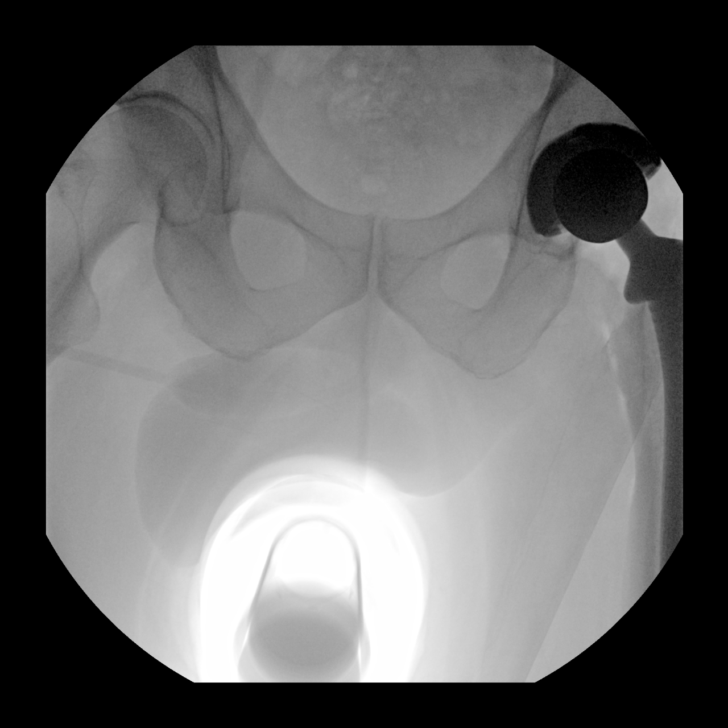
[im 8/8]
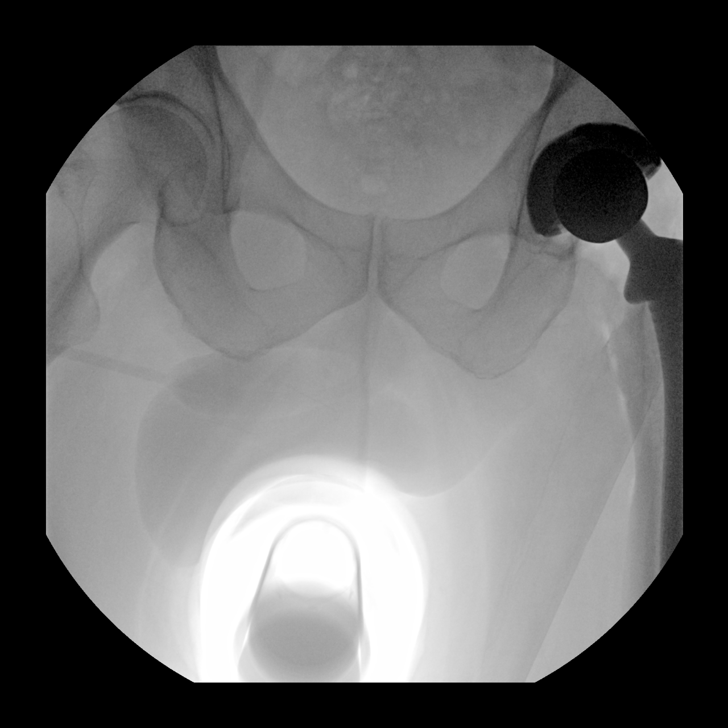

[8 of 8 positions shown; findings below may reference images not displayed]

FINDINGS: Seven fluoroscopic spot views of the pelvis and left hip obtained
during left hip arthroplasty. Arthroplasty in expected alignment.
Total fluoroscopy time 25 seconds. Total dose 3.27 mGy.
IMPRESSION: Procedural fluoroscopy during left hip arthroplasty.

## 2023-01-29 ENCOUNTER — Ambulatory Visit: Payer: Medicare Other | Admitting: Podiatry

## 2023-02-10 ENCOUNTER — Ambulatory Visit: Payer: Medicare Other | Admitting: Podiatry

## 2023-02-17 ENCOUNTER — Ambulatory Visit: Payer: Medicare Other | Admitting: Podiatry

## 2023-02-24 ENCOUNTER — Other Ambulatory Visit: Payer: Medicare Other

## 2023-02-24 ENCOUNTER — Encounter: Payer: Medicare Other | Admitting: Genetic Counselor

## 2023-03-25 ENCOUNTER — Encounter: Payer: Self-pay | Admitting: Neurology

## 2023-03-25 ENCOUNTER — Ambulatory Visit (INDEPENDENT_AMBULATORY_CARE_PROVIDER_SITE_OTHER): Payer: Medicare Other | Admitting: Neurology

## 2023-03-25 VITALS — BP 126/72 | HR 66 | Ht 73.0 in | Wt 223.4 lb

## 2023-03-25 DIAGNOSIS — G4733 Obstructive sleep apnea (adult) (pediatric): Secondary | ICD-10-CM | POA: Diagnosis not present

## 2023-03-25 NOTE — Progress Notes (Signed)
Provider:  [name], MD  Primary Care Physician:  Charlane Ferretti, DO [address] [place] [address]     Referring Provider: Charlane Ferretti, Do [address] [place],  [place] [address]          Chief Complaint according to patient   Patient presents with:     New Patient (Initial Visit)           HISTORY OF PRESENT ILLNESS:  Joel Brown is a 72 y.o. male patient and retired Technical sales engineer from [place] who is here for revisit 03/25/2023 for  a new order for a new CPAP.   Chief concern according to patient :  I need now a new machine , but medicare will need a new sleep study again.     03-25-2023: I have the pleasure of seeing Mr. Christos Mixson and a repeat visit today on this patient tested positive for a mild to moderate degree of sleep apnea that was not REM sleep dependent but strongly dependent on his sleep position.  His supine sleep apnea hypopnea index was 29.5/h and in nonsupine position 9.5/h so avoiding sleeping on the back is definitely important for this patient.  He did not have hypoxia of clinical significance he did have intermittent bradycardia also.  Based on the data I had suggested and ordered a CPAP with the settings between 10 and 16 cmH2O pressure and 2 cm expiratory relief.  I met him last when his CPAP was not yet 70 years old but it does now need to be replaced Medicare will require pressure "" new work home sleep test and since he has 2 sleep test in his past that both documented mild to moderate sleep apnea I expect that we will see the same.    There has been no change in daytime sleepiness or and fatigue, the Epworth Sleepiness Scale was endorsed at 10 points out of 24 which is an average, the fatigue severity scale was 17 out of 63 points which is below average.    Medication wise , there is no sedative taken: the patient is on Lipitor, he takes glucosamine as a supplement and multivitamins with mineral neurovaccine eyedrops and he was started  on olmesartan 40 mg daily for blood pressure control.      Paper referral from [facility], DO for OSA eval. Moved from [place] to [facility] in November 2021, to be closer to daughter. Has current machine. Has travel cpap machine that he would like to learn on how to use.  Brought old CPAP machine with him as well.       HISTORY OF PRESENT ILLNESS:  Joel Brown is a 72 y.o. year old White or Caucasian male patient seen here as a referral on 07/28/2021 from Dr Thornell Mule  for a Sleep consultation.    .  Chief concern according to patient : 07-28-2021-  moved here from [place] and was tested for OSA at [facility] 7 years ago. Has been on a CPAP and this is now over 9 years old.  He remembers that he had frequent nocturia, nocturnal bathroom breaks.  And then was apparently diagnosed with sleep apnea and an AHI of 21.0 the recent "" diagnostic study here was quoted by his [facility] provider on 10-04-2017 so I suspect that his sleep study is just 78-1/70 years old.  He felt worn out to titration that he could actually sleep better through the night and had no longer  as many bathroom breaks.  Therapy data were collected through [facility] and showed that the 95th percentile pressure was 12.1 cmH2O airleak 95th percentile pressure 13.2 L/min, residual AHI was 9.4 of which 4.3 per central apneas and 3.9 were obstructive apneas.  This is surprising to me and he had an AutoSet which was was provided at factory settings.  After the visit in October 2018 his CPAP was restricted to 10 from 16 cm water pressure.     He was asked not to sleep in supine position and to avoid sleeping when drowsy.  To incorporate some weight loss management.  His BMI was 29 at the time.  I think this was the last available CPAP data.  Today I can look at his current device use he is a highly compliant CPAP user on 07-28-2021 he is 100% compliant by days of 97% compliant by hours with an average of 5 hours 58 minutes.  Minimum  pressure is now 10 maximum pressure 16 cmH2O with 2 cm expiratory pressure relief the residual AHI is only 2.8 and he has only 0.7 central apneas and 0.9 obstructive apneas.  Air leak mid is high at the 95th percentile 35.8 L/min so we may have to work on his mask fit, his pressure is at the 95th percentile 13.2 cmH2O.   So the CPAP is 70 years old issued in August 2018.  Serial number is [id number] air sense 10 AutoSet.    08-25-2021:  Patient's machine is not yet 70 years old and we will order supplies in the meantime but the patient stays on his current machine, replacement will be possible in August 2023. REFERRING CLINICIAN: Charlane Ferretti , DO    10 - 16 cm water pressure.   His current device download in the clinic showed a highly compliant patient 97% compliance by hours with an average of 5 hours 58 minutes of daily use.  EPR is set at 2 cmH2O, his residual AHI is now only 2.8/h almost equal divided between central and obstructive apneas.  His mask seems to leak quite a bit his 95th percentile pressure is 13.2 cmH2O.  His machine is now a little over 41 years old.   Respiratory Indices: The calculated apnea-hypopnea index was 17.5/h during REM sleep 14.7 and in non-REM sleep 18.7/h.  There was a strong positional component noted as the supine sleep AHI was 29.5 and the nonsupine 9.5/h.  Snoring was average with a mean volume of 40 dB and snoring was only present for about 6% of the sleep time.                                                                 IMPRESSION:  This HST confirms the presence of mild to moderate sleep apnea and no significant central component was seen here in the baseline evaluation.  There is intermittent bradycardia present but no hypoxia.       Review of Systems: Out of a complete 14 system review, the patient complains of only the following symptoms, and all other reviewed systems are negative.:  Fatigue, sleepiness , snoring, on CPAP-with ESON nasal mask.   How likely are you to doze in the following situations: 0 = not likely, 1 = slight chance, 2 =  moderate chance, 3 = high chance   Sitting and Reading? Watching Television? Sitting inactive in a public place (theater or meeting)? As a passenger in a car for an hour without a break? Lying down in the afternoon when circumstances permit? Sitting and talking to someone? Sitting quietly after lunch without alcohol? In a car, while stopped for a few minutes in traffic?   Total = 10/ 24 points   FSS endorsed at 17/ 63 points.   Social History   Socioeconomic History   Marital status: Married    Spouse name: Candace   Number of children: 1   Years of education: BA   Highest education level: Not on file  Occupational History   Not on file  Tobacco Use   Smoking status: Never   Smokeless tobacco: Never  Vaping Use   Vaping Use: Never used  Substance and Sexual Activity   Alcohol use: Yes    Comment: 1/month   Drug use: Never   Sexual activity: Not on file  Other Topics Concern   Not on file  Social History Narrative   2-3 cups caffeine per day (soda/coffee)   Right handed   Social Determinants of Health   Financial Resource Strain: Not on file  Food Insecurity: Not on file  Transportation Needs: Not on file  Physical Activity: Not on file  Stress: Not on file  Social Connections: Not on file    No family history on file.  Past Medical History:  Diagnosis Date   Arthritis    fingers, hip   Bilateral corneal abrasions    uses an ointment daily   Heart murmur    Hyperlipidemia    Sleep apnea     Past Surgical History:  Procedure Laterality Date   EYE SURGERY Bilateral 2010   HERNIA REPAIR     TONSILLECTOMY     as a child   TOTAL HIP ARTHROPLASTY Left 03/14/2021   Procedure: LEFT TOTAL HIP ARTHROPLASTY ANTERIOR APPROACH;  Surgeon: Kathryne Hitch, MD;  Location: [facility];  Service: Orthopedics;  Laterality: Left;   WISDOM TOOTH EXTRACTION        Current Outpatient Medications on File Prior to Visit  Medication Sig Dispense Refill   olmesartan (BENICAR) 40 MG tablet Take 40 mg by mouth daily.     atorvastatin (LIPITOR) 10 MG tablet Take 20 mg by mouth daily.     COVID-19 mRNA bivalent vaccine, Pfizer, (PFIZER COVID-19 VAC BIVALENT) injection Inject into the muscle. 0.3 mL 0   Glucosamine HCl 1500 MG TABS Take 3,000 mg by mouth daily. Dona     influenza vaccine adjuvanted (FLUAD) 0.5 ML injection Inject into the muscle. 0.5 mL 0   Multiple Vitamins-Minerals (MULTIVITAMIN WITH MINERALS) tablet Take 3 tablets by mouth daily. Package with a total of 6 pill   Omega 3 Co q 10/ phyto Probiotic     sodium chloride (MURO 128) 5 % ophthalmic ointment Place 1 application into both eyes at bedtime.     Sodium Hyaluronate, oral, (HYALURONIC ACID PO) Take 3 capsules by mouth daily. With supplement     No current facility-administered medications on file prior to visit.    Allergies  Allergen Reactions   Iodine Itching    IV IODINE    Penicillins Other (See Comments)    unknown   Latex Rash     DIAGNOSTIC DATA (LABS, IMAGING, TESTING) - I reviewed patient records, labs, notes, testing and imaging myself where available.  Lab Results  Component Value Date   WBC 12.3 (H) 03/15/2021   HGB 14.2 03/15/2021   HCT 40.9 03/15/2021   MCV 92.1 03/15/2021   PLT 202 03/15/2021      Component Value Date/Time   NA 134 (L) 03/15/2021 0256   K 4.5 03/15/2021 0256   CL 104 03/15/2021 0256   CO2 24 03/15/2021 0256   GLUCOSE 152 (H) 03/15/2021 0256   BUN 20 03/15/2021 0256   CREATININE 1.30 (H) 03/15/2021 0256   CALCIUM 8.3 (L) 03/15/2021 0256   GFRNONAA >60 03/15/2021 0256   No results found for: "CHOL", "HDL", "LDLCALC", "LDLDIRECT", "TRIG", "CHOLHDL" No results found for: "HGBA1C" No results found for: "VITAMINB12" No results found for: "TSH"  PHYSICAL EXAM:  Today's Vitals   03/25/23 0822  BP: 126/72  Pulse: 66  Weight: 223  lb 6.4 oz (101.3 kg)  Height:  (1.854 m)   Body mass index is 29.47 kg/m.   Wt Readings from Last 3 Encounters:  03/25/23 223 lb 6.4 oz (101.3 kg)  07/28/21 224 lb 8 oz (101.8 kg)  03/14/21 219 lb (99.3 kg)     Ht Readings from Last 3 Encounters:  03/25/23  (1.854 m)  07/28/21 6' 1.5" (1.867 m)  03/14/21  (1.854 m)      General:  The patient is awake, alert and appears not in acute distress. The patient is well groomed. Head: Normocephalic, atraumatic. Neck is supple. Mallampati 3,  neck circumference:17 inches . Nasal airflow  patent.  Retrognathia is not seen.  Dental status: biologica Cardiovascular:  Regular rate and cardiac rhythm by pulse,  without distended neck veins. Respiratory: Lungs are clear to auscultation.  Skin:  Without evidence of ankle edema, or rash. Trunk: The patient's posture is erect.   Neurologic exam : The patient is awake and alert, oriented to place and time.   Memory subjective described as intact.  Attention span & concentration ability appears normal.  Speech is fluent,  without  dysarthria, dysphonia or aphasia.  Mood and affect are appropriate.   Cranial nerves: no loss of smell or taste reported  Pupils are equal and briskly reactive to light. Funduscopic exam deferred. .  Extraocular movements in vertical and horizontal planes were  with a left sided  nystagmus. No Diplopia. Visual fields by finger perimetry are intact. Hearing was intact to soft voice and finger rubbing.    Facial sensation intact to fine touch.  Facial motor strength is symmetric and tongue and uvula move midline.  Neck ROM : rotation, tilt and flexion extension were normal for age and shoulder shrug was symmetrical.    Motor exam:  Symmetric bulk, tone and ROM.   Normal tone without cog- wheeling, symmetric grip strength .   Sensory:  Fine touch, pinprick and vibration were tested  and  normal.  Proprioception tested in the upper extremities was  normal.   Coordination: Rapid alternating movements in the fingers/hands were of normal speed.  The Finger-to-nose maneuver was intact without evidence of ataxia, dysmetria or tremor.   Gait and station: Patient could rise unassisted from a seated position, walked without assistive device.  Stance is of normal width/ base and the patient turned with 3 steps.  Toe and heel walk were deferred.  Deep tendon reflexes: in the  upper and lower extremities are symmetric and intact.  Lower ext- strong blateral patella reflexes.  Babinski response was deferred     ASSESSMENT AND PLAN 72 y.o. year old male  here with:    1) OSA, mild - moderate with need for a new CPAP, now over 73 years old   2) No additional health history affecting him over the last 24 months.   3) repeated HST ASAP and will order CPAP.      I plan to follow up either personally or through our NP within 3 months.   I would like to thank Charlane Ferretti, DO and Charlane Ferretti, Do [address] [place],  [place] [address] for allowing me to meet with and to take care of this pleasant patient.   CC: I will share my notes with PCP.  After spending a total time of  25  minutes face to face and additional time for physical and neurologic examination, review of laboratory studies,  personal review of imaging studies, reports and results of other testing and review of referral information / records as far as provided in visit,   Electronically signed by: [name], MD 03/25/2023 8:44 AM  [facility] Board certified by The [employer] of Sleep Medicine and Diplomate of the [employer] of Sleep Medicine. Board certified In Neurology through the ABPN, Fellow of the [employer] of Neu[employer]. Medical Director of [facility].

## 2023-03-25 NOTE — Patient Instructions (Signed)

## 2023-03-30 ENCOUNTER — Telehealth: Payer: Self-pay | Admitting: Neurology

## 2023-03-30 NOTE — Telephone Encounter (Signed)
HST- medicare/aetna supp no auth req   Patient is on the schedule for 04/20/23 at 8:30 AM  Mailed packet to the patient.

## 2023-04-05 ENCOUNTER — Encounter: Payer: Self-pay | Admitting: Genetic Counselor

## 2023-04-06 ENCOUNTER — Inpatient Hospital Stay: Payer: Medicare Other | Attending: Genetic Counselor | Admitting: Genetic Counselor

## 2023-04-06 ENCOUNTER — Other Ambulatory Visit: Payer: Self-pay

## 2023-04-06 ENCOUNTER — Other Ambulatory Visit: Payer: Self-pay | Admitting: Genetic Counselor

## 2023-04-06 ENCOUNTER — Inpatient Hospital Stay: Payer: Medicare Other

## 2023-04-06 ENCOUNTER — Encounter: Payer: Self-pay | Admitting: Genetic Counselor

## 2023-04-06 DIAGNOSIS — Z800 Family history of malignant neoplasm of digestive organs: Secondary | ICD-10-CM

## 2023-04-06 DIAGNOSIS — Z8042 Family history of malignant neoplasm of prostate: Secondary | ICD-10-CM | POA: Diagnosis not present

## 2023-04-06 DIAGNOSIS — Z8049 Family history of malignant neoplasm of other genital organs: Secondary | ICD-10-CM | POA: Diagnosis not present

## 2023-04-06 LAB — GENETIC SCREENING ORDER

## 2023-04-06 NOTE — Progress Notes (Addendum)
REFERRING PROVIDER: Charlane Ferretti, DO [address] [place],  [place] [address]  PRIMARY PROVIDER:  Charlane Ferretti, DO  PRIMARY REASON FOR VISIT:  1. Family history of uterine cancer   2. Family history of prostate cancer   3. Family history of pancreatic cancer      HISTORY OF PRESENT ILLNESS:   Joel Brown, a 72 y.o. male, was seen for a [place] cancer genetics consultation at the request of Dr. Thornell Brown due to a family history of cancer.  Joel Brown presents to clinic today to discuss the possibility of a hereditary predisposition to cancer, genetic testing, and to further clarify his future cancer risks, as well as potential cancer risks for family members.   Joel Brown is a 72 y.o. male with no personal history of cancer.    CANCER HISTORY:  Oncology History   No history exists.    Past Medical History:  Diagnosis Date   Arthritis    fingers, hip   Bilateral corneal abrasions    uses an ointment daily   Family history of pancreatic cancer    Family history of prostate cancer    Family history of uterine cancer    Heart murmur    Hyperlipidemia    Sleep apnea     Past Surgical History:  Procedure Laterality Date   EYE SURGERY Bilateral 2010   HERNIA REPAIR     TONSILLECTOMY     as a child   TOTAL HIP ARTHROPLASTY Left 03/14/2021   Procedure: LEFT TOTAL HIP ARTHROPLASTY ANTERIOR APPROACH;  Surgeon: Kathryne Hitch, MD;  Location: [facility];  Service: Orthopedics;  Laterality: Left;   WISDOM TOOTH EXTRACTION      Social History   Socioeconomic History   Marital status: Married    Spouse name: Candace   Number of children: 1   Years of education: BA   Highest education level: Not on file  Occupational History   Not on file  Tobacco Use   Smoking status: Never   Smokeless tobacco: Never  Vaping Use   Vaping Use: Never used  Substance and Sexual Activity   Alcohol use: Yes    Comment: 1/month   Drug use: Never   Sexual activity: Not on file   Other Topics Concern   Not on file  Social History Narrative   2-3 cups caffeine per day (soda/coffee)   Right handed   Social Determinants of Health   Financial Resource Strain: Not on file  Food Insecurity: Not on file  Transportation Needs: Not on file  Physical Activity: Not on file  Stress: Not on file  Social Connections: Not on file     FAMILY HISTORY:  We obtained a detailed, 4-generation family history.  Significant diagnoses are listed below: Family History  Problem Relation Age of Onset   Dementia Mother    Heart disease Father        aortic valve replacement in his 41s   Heart disease Sister        aortic valve dz   Heart murmur Sister    Pancreatic cancer Brother 74   Uterine cancer Maternal Aunt 33   Brain cancer Maternal Aunt 73 - 60   Lung cancer Maternal Uncle        both were smokers   Prostate cancer Maternal Uncle    Cancer Paternal Uncle        NOS   Cancer Paternal Grandmother    Bone cancer Cousin  maternal first cousin   Cancer Cousin        maternal first cousin with NOS cancer     The patient had two daughters, one who has passed away from non-cancer related issues.  He had two brothers and two sisters, one brother died of pancreatic cancer.  Both parents are deceased.  The patient's father died of heart disease.  He had two brothers, one may have had cancer.  His parents are deceased from non-cancer related issues.  The patient's mother had dementia. She had three brothers and four sisters.  Two brothers had lung cancer, one brother had prostate cancer, one sister had uterine cancer at 91 and one sister had brain cancer.  The maternal grandparents are deceased from non-cancer related issues.  Joel Brown is unaware of previous family history of genetic testing for hereditary cancer risks. Patient's maternal ancestors are of [place] descent, and paternal ancestors are of [place] descent. There is no reported Ashkenazi Jewish ancestry.  There is no known consanguinity.  GENETIC COUNSELING ASSESSMENT: Joel Brown is a 72 y.o. male with a family history of cancer which is somewhat suggestive of a hereditary cancer syndrome, such as Lynch syndrome, and predisposition to cancer given the young age of onset and combination of cancer. We, therefore, discussed and recommended the following at today's visit.   DISCUSSION: We discussed that, in general, most cancer is not inherited in families, but instead is sporadic or familial. Sporadic cancers occur by chance and typically happen at older ages (>50 years) as this type of cancer is caused by genetic changes acquired during an individual's lifetime. Some families have more cancers than would be expected by chance; however, the ages or types of cancer are not consistent with a known genetic mutation or known genetic mutations have been ruled out. This type of familial cancer is thought to be due to a combination of multiple genetic, environmental, hormonal, and lifestyle factors. While this combination of factors likely increases the risk of cancer, the exact source of this risk is not currently identifiable or testable.  We discussed that up to 15% of pancreatic cancer is hereditary, with most cases associated with BRCA mutations.  There are other genes that can be associated with hereditary pancreatic cancer syndromes.  These include ATM, PALB2 and Lynch syndrome MMR genes.  Based on the combination of cancer and young age of onset of uterine cancer in the family we are most concerned for Lynch syndrome  We discussed that testing is beneficial for several reasons including knowing how to follow individuals after completing their treatment, identifying whether potential treatment options such as PARP inhibitors would be beneficial, and understand if other family members could be at risk for cancer and allow them to undergo genetic testing.   We reviewed the characteristics, features and  inheritance patterns of hereditary cancer syndromes. We also discussed genetic testing, including the appropriate family members to test, the process of testing, insurance coverage and turn-around-time for results. We discussed the implications of a negative, positive, carrier and/or variant of uncertain significant result. Joel Brown  was offered a common hereditary cancer panel (47 genes) and an expanded pan-cancer panel (77 genes). Joel Brown was informed of the benefits and limitations of each panel, including that expanded pan-cancer panels contain genes that do not have clear management guidelines at this point in time.  We also discussed that as the number of genes included on a panel increases, the chances of variants of uncertain significance increases. Mr.  Brown decided to pursue genetic testing for the CancerNext-Expanded+RNAinsight gene panel.   The CancerNext-Expanded gene panel offered by [employer] and includes sequencing and rearrangement analysis for the following 71 genes: AIP, ALK, APC, ATM, BAP1, BARD1, BMPR1A, BRCA1, BRCA2, BRIP1, CDC73, CDH1, CDK4, CDKN1B, CDKN2A, CHEK2, DICER1, FH, FLCN, KIF1B, LZTR1, MAX, MEN1, MET, MLH1, MSH2, MSH6, MUTYH, NF1, NF2, NTHL1, PALB2, PHOX2B, PMS2, POT1, PRKAR1A, PTCH1, PTEN, RAD51C, RAD51D, RB1, RET, SDHA, SDHAF2, SDHB, SDHC, SDHD, SMAD4, SMARCA4, SMARCB1, SMARCE1, STK11, SUFU, TMEM127, TP53, TSC1, TSC2 and VHL (sequencing and deletion/duplication); AXIN2, CTNNA1, EGFR, EGLN1, HOXB13, KIT, MITF, MSH3, PDGFRA, POLD1 and POLE (sequencing only); EPCAM and GREM1 (deletion/duplication only). RNA data is routinely analyzed for use in variant interpretation for all genes.   Based on Joel Brown family history of cancer, he meets medical criteria for genetic testing. Despite that he meets criteria, he may still have an out of pocket cost. We discussed that if his out of pocket cost for testing is over $100, the laboratory will call and confirm whether  he wants to proceed with testing.  If the out of pocket cost of testing is less than $100 he will be billed by the genetic testing laboratory.   We discussed that some people do not want to undergo genetic testing due to fear of genetic discrimination.  The Genetic Information Nondiscrimination Act (GINA) was signed into federal law in 2008. GINA prohibits health insurers and most employers from discriminating against individuals based on genetic information (including the results of genetic tests and family history information). According to GINA, health insurance companies cannot consider genetic information to be a preexisting condition, nor can they use it to make decisions regarding coverage or rates. GINA also makes it illegal for most employers to use genetic information in making decisions about hiring, firing, promotion, or terms of employment. It is important to note that GINA does not offer protections for life insurance, disability insurance, or long-term care insurance. GINA does not apply to those in the [employer], those who work for companies with less than 15 employees, and new life insurance or long-term disability insurance policies.  Health status due to a cancer diagnosis is not protected under GINA. More information about GINA can be found by visiting [email or web address].   PLAN: After considering the risks, benefits, and limitations, Joel Brown provided informed consent to pursue genetic testing and the blood sample was sent to [facility] for analysis of the CancerNext-Expanded+RNAinsight. Results should be available within approximately 2-3 weeks' time, at which point they will be disclosed by telephone to Joel Brown, as will any additional recommendations warranted by these results. Joel Brown will receive a summary of his genetic counseling visit and a copy of his results once available. This information will also be available in Epic.   Lastly, we encouraged Mr.  Brown to remain in contact with cancer genetics annually so that we can continuously update the family history and inform him of any changes in cancer genetics and testing that may be of benefit for this family.   Joel Brown questions were answered to his satisfaction today. Our contact information was provided should additional questions or concerns arise. Thank you for the referral and allowing [place] to share in the care of your patient.   [place] Silveria P. Lowell Guitar, MS, [facility] Licensed, Patent attorney [email or web address] phone: [phone]  The patient was seen for a total of 45 minutes in face-to-face genetic counseling.  The patient was seen alone.  Drs. Mosetta Putt, Iruku, and/or  Pamelia Hoit were available for questions, if needed..    _______________________________________________________________________ For Office Staff:  Number of people involved in session: 1 Was an Intern/ student involved with case: no

## 2023-04-20 ENCOUNTER — Ambulatory Visit (INDEPENDENT_AMBULATORY_CARE_PROVIDER_SITE_OTHER): Payer: Medicare Other | Admitting: Neurology

## 2023-04-20 DIAGNOSIS — G4733 Obstructive sleep apnea (adult) (pediatric): Secondary | ICD-10-CM | POA: Diagnosis not present

## 2023-04-21 ENCOUNTER — Ambulatory Visit: Payer: Self-pay | Admitting: Genetic Counselor

## 2023-04-21 ENCOUNTER — Telehealth: Payer: Self-pay | Admitting: Genetic Counselor

## 2023-04-21 ENCOUNTER — Encounter: Payer: Self-pay | Admitting: Genetic Counselor

## 2023-04-21 DIAGNOSIS — Z1379 Encounter for other screening for genetic and chromosomal anomalies: Secondary | ICD-10-CM | POA: Insufficient documentation

## 2023-04-21 NOTE — Progress Notes (Addendum)
HPI:  Mr. Chann was previously seen in the [facility] due to a family history of cancer and concerns regarding a hereditary predisposition to cancer. Please refer to our prior cancer genetics clinic note for more information regarding our discussion, assessment and recommendations, at the time. Mr. Souder recent genetic test results were disclosed to him, as were recommendations warranted by these results. These results and recommendations are discussed in more detail below.  CANCER HISTORY:  Oncology History   No history exists.    FAMILY HISTORY:  We obtained a detailed, 4-generation family history.  Significant diagnoses are listed below: Family History  Problem Relation Age of Onset   Dementia Mother    Heart disease Father        aortic valve replacement in his 18s   Heart disease Sister        aortic valve dz   Heart murmur Sister    Pancreatic cancer Brother 36   Uterine cancer Maternal Aunt 5   Brain cancer Maternal Aunt 77 - 60   Lung cancer Maternal Uncle        both were smokers   Prostate cancer Maternal Uncle    Cancer Paternal Uncle        NOS   Cancer Paternal Grandmother    Bone cancer Cousin        maternal first cousin   Cancer Cousin        maternal first cousin with NOS cancer       The patient had two daughters, one who has passed away from non-cancer related issues.  He had two brothers and two sisters, one brother died of pancreatic cancer.  Both parents are deceased.   The patient's father died of heart disease.  He had two brothers, one may have had cancer.  His parents are deceased from non-cancer related issues.   The patient's mother had dementia. She had three brothers and four sisters.  Two brothers had lung cancer, one brother had prostate cancer, one sister had uterine cancer at 42 and one sister had brain cancer.  The maternal grandparents are deceased from non-cancer related issues.   Mr. Ramsier is unaware of  previous family history of genetic testing for hereditary cancer risks. Patient's maternal ancestors are of [place] descent, and paternal ancestors are of [place] descent. There is no reported Ashkenazi Jewish ancestry. There is no known consanguinity  GENETIC TEST RESULTS: Genetic testing reported out on Apr 19, 2023 through the CancerNext-Expanded+RNAinsight cancer panel found no pathogenic mutations. The CancerNext-Expanded gene panel offered by [employer] and includes sequencing and rearrangement analysis for the following 71 genes: AIP, ALK, APC, ATM, BAP1, BARD1, BMPR1A, BRCA1, BRCA2, BRIP1, CDC73, CDH1, CDK4, CDKN1B, CDKN2A, CHEK2, DICER1, FH, FLCN, KIF1B, LZTR1, MAX, MEN1, MET, MLH1, MSH2, MSH6, MUTYH, NF1, NF2, NTHL1, PALB2, PHOX2B, PMS2, POT1, PRKAR1A, PTCH1, PTEN, RAD51C, RAD51D, RB1, RET, SDHA, SDHAF2, SDHB, SDHC, SDHD, SMAD4, SMARCA4, SMARCB1, SMARCE1, STK11, SUFU, TMEM127, TP53, TSC1, TSC2 and VHL (sequencing and deletion/duplication); AXIN2, CTNNA1, EGFR, EGLN1, HOXB13, KIT, MITF, MSH3, PDGFRA, POLD1 and POLE (sequencing only); EPCAM and GREM1 (deletion/duplication only). RNA data is routinely analyzed for use in variant interpretation for all genes. The test report has been scanned into EPIC and is located under the Molecular Pathology section of the Results Review tab.  A portion of the result report is included below for reference.     We discussed with Mr. Vongphakdy that because current genetic testing is not perfect, it is  possible there may be a gene mutation in one of these genes that current testing cannot detect, but that chance is small.  We also discussed, that there could be another gene that has not yet been discovered, or that we have not yet tested, that is responsible for the cancer diagnoses in the family. It is also possible there is a hereditary cause for the cancer in the family that Mr. Santoli did not inherit and therefore was not identified in his testing.  Therefore,  it is important to remain in touch with cancer genetics in the future so that we can continue to offer Mr. Keizer the most up to date genetic testing.   ADDITIONAL GENETIC TESTING: We discussed with Mr. Romanoski that his genetic testing was fairly extensive.  If there are genes identified to increase cancer risk that can be analyzed in the future, we would be happy to discuss and coordinate this testing at that time.    CANCER SCREENING RECOMMENDATIONS: Mr. Setters test result is considered negative (normal).  This means that we have not identified a hereditary cause for his family history of cancer at this time. Most cancers happen by chance and this negative test suggests that his cancer may fall into this category.    Possible reasons for Mr. Doucet's negative genetic test include:  1. There may be a gene mutation in one of these genes that current testing methods cannot detect but that chance is small.  2. There could be another gene that has not yet been discovered, or that we have not yet tested, that is responsible for the cancer diagnoses in the family.  3.  There may be no hereditary risk for cancer in the family. The cancers in Mr. Codding and/or his family may be sporadic/familial or due to other genetic and environmental factors. 4. It is also possible there is a hereditary cause for the cancer in the family that Mr. Arons did not inherit.  Therefore, it is recommended he continue to follow the cancer management and screening guidelines provided by his primary healthcare provider. An individual's cancer risk and medical management are not determined by genetic test results alone. Overall cancer risk assessment incorporates additional factors, including personal medical history, family history, and any available genetic information that may result in a personalized plan for cancer prevention and surveillance  RECOMMENDATIONS FOR FAMILY MEMBERS:  Individuals in this family might be  at some increased risk of developing cancer, over the general population risk, simply due to the family history of cancer.  We recommended women in this family have a yearly mammogram beginning at age 63, or 18 years younger than the earliest onset of cancer, an annual clinical breast exam, and perform monthly breast self-exams. Women in this family should also have a gynecological exam as recommended by their primary provider. All family members should be referred for colonoscopy starting at age 74.  It is also possible there is a hereditary cause for the cancer in Mr. Klein family that he did not inherit and therefore was not identified in him.  Based on Mr. Covalt family history, we recommended his maternal aunt, who was diagnosed with uterine cancer at age 85, have genetic counseling and testing. Mr. Nottage will let us know if we can be of any assistance in coordinating genetic counseling and/or testing for this family member.   FOLLOW-UP: Lastly, we discussed with Mr. Stcharles that cancer genetics is a rapidly advancing field and it is possible that new genetic  tests will be appropriate for him and/or his family members in the future. We encouraged him to remain in contact with cancer genetics on an annual basis so we can update his personal and family histories and let him know of advances in cancer genetics that may benefit this family.   Our contact number was provided. Mr. Ricchio questions were answered to his satisfaction, and he knows he is welcome to call us at anytime with additional questions or concerns.   Maylon Cos, MS, [facility] Licensed, Certified Genetic Counselor [email or web address]

## 2023-04-21 NOTE — Telephone Encounter (Signed)
Revealed negative genetic testing.  Discussed that we do not know why there is cancer in the family. It could be due to a different gene that we are not testing, or maybe our current technology may not be able to pick something up.  It will be important for him to keep in contact with genetics to keep up with whether additional testing may be needed.    

## 2023-04-21 NOTE — Progress Notes (Signed)
[facility] Joel Common "[name]" 72 year old male 09/16/1951   HOME SLEEP TEST REPORT ( by [facility])   STUDY DATE:  04-21-2023   ORDERING CLINICIAN: [name], MD  REFERRING CLINICIAN: Dr Thornell Mule   CLINICAL INFORMATION/HISTORY: 03-25-2023: I have the pleasure of seeing [name] and a repeat visit today on this patient tested positive for a mild to moderate degree of sleep apnea that was not REM sleep dependent but strongly dependent on his sleep position.  His supine sleep apnea hypopnea index was 29.5/h and in nonsupine position 9.5/h so avoiding sleeping on the back is definitely important for this patient.  He did not have hypoxia of clinical significance he did have intermittent bradycardia also.  Based on the data I had suggested and ordered a CPAP with the settings between 10 and 16 cmH2O pressure and 2 cm expiratory relief.  I met him last when his CPAP was not yet 72 years old but it does now need to be replaced Medicare will require pressure "" new work home sleep test and since he has 2 sleep test in his past that both documented mild to moderate sleep apnea I expect that we will see the same.       Epworth sleepiness score: 10/24.  FSS at 17/63    BMI: 29. 5kg/m   Neck Circumference: 17"   FINDINGS:   Sleep Summary:   Total Recording Time (hours, min):   8 hours 5 minutes  Total Sleep Time (hours, min):   7 hours 10 minutes              Percent REM (%): 23.5% REM sleep                                      Respiratory Indices by AASM criteria:   Calculated pAHI (per hour): 30.4/h       , and 13.5/h by CMS criteria                    REM pAHI:    43/h                and 25/h by CMS criteria                             NREM pAHI:    26/h               9.9/h by CMS criteria           Positional AHI:    Supine sleep was associated with an AHI of 34.5/h while left-sided sleep was associated with an AHI of 17.2/h. Snoring  reached a mean volume of 41 dB but was present for almost 50% of total sleep time.                                               Oxygen Saturation Statistics:     O2 Saturation Range (%):    Between a nadir of 87 and a maximum of 97% -artifact was corrected.  O2 Saturation (minutes) <89%: 0 minutes         Pulse Rate Statistics:   Pulse Mean (bpm):       59 bpm          Pulse Range:     Between a minimum of 51 and a maximum of 95 bpm            IMPRESSION:  This HST confirms the presence of severe sleep apnea following AASM guidelines. When applying the CMS guidelines this is an apnea between mild and moderate.      RECOMMENDATION: Continuation of obstructive sleep apnea therapy by CPAP was recommended I will order a new auto titration CPAP,Between a pressure setting of 10 and a pressure setting of 17 cm water was 2 cm EPR heated humidifier and mask of choice    INTERPRETING PHYSICIAN:   [name], MD / [facility].

## 2023-04-28 NOTE — Addendum Note (Signed)
Addended by: [name] on: 04/28/2023 05:27 PM   Modules accepted: Orders

## 2023-04-28 NOTE — Procedures (Signed)
[facility] Joel Common "[name]" 72 year old male 06-21-1951   HOME SLEEP TEST REPORT ( by Watch PAT)   STUDY DATE:  04-21-2023   ORDERING CLINICIAN: [name], MD  REFERRING CLINICIAN: Dr Thornell Mule   CLINICAL INFORMATION/HISTORY: 03-25-2023: I have the pleasure of seeing Joel Brown and a repeat visit today on this patient tested positive for a mild to moderate degree of sleep apnea that was not REM sleep dependent but strongly dependent on his sleep position.  His supine sleep apnea hypopnea index was 29.5/h and in nonsupine position 9.5/h so avoiding sleeping on the back is definitely important for this patient.  He did not have hypoxia of clinical significance he did have intermittent bradycardia also.  Based on the data I had suggested and ordered a CPAP with the settings between 10 and 16 cmH2O pressure and 2 cm expiratory relief.  I met him last when his CPAP was not yet 72 years old but it does now need to be replaced Medicare will require pressure "" new work home sleep test and since he has 2 sleep test in his past that both documented mild to moderate sleep apnea I expect that we will see the same.       Epworth sleepiness score: 10/24.  FSS at 17/63    BMI: 29. 5kg/m   Neck Circumference: 17"   FINDINGS:   Sleep Summary:   Total Recording Time (hours, min):   8 hours 5 minutes  Total Sleep Time (hours, min):   7 hours 10 minutes              Percent REM (%): 23.5% REM sleep                                      Respiratory Indices by AASM criteria:   Calculated pAHI (per hour): 30.4/h       , and 13.5/h by CMS criteria                    REM pAHI:    43/h                and 25/h by CMS criteria                             NREM pAHI:    26/h               9.9/h by CMS criteria           Positional AHI:    Supine sleep was associated with an AHI of 34.5/h while left-sided sleep was associated with an AHI of 17.2/h. Snoring reached a  mean volume of 41 dB but was present for almost 50% of total sleep time.                                               Oxygen Saturation Statistics:     O2 Saturation Range (%):    Between a nadir of 87 and a maximum of 97% -artifact was corrected.  O2 Saturation (minutes) <89%: 0 minutes         Pulse Rate Statistics:   Pulse Mean (bpm):       59 bpm          Pulse Range:     Between a minimum of 51 and a maximum of 95 bpm            IMPRESSION:  This HST confirms the presence of severe sleep apnea following AASM guidelines. When applying the CMS guidelines this is an apnea between mild and moderate.      RECOMMENDATION: Continuation of obstructive sleep apnea therapy by CPAP was recommended I will order a new auto titration CPAP,Between a pressure setting of 10 and a pressure setting of 17 cm water was 2 cm EPR heated humidifier and mask of choice    INTERPRETING PHYSICIAN:   [name], MD / [facility].

## 2023-04-29 ENCOUNTER — Telehealth: Payer: Self-pay | Admitting: Neurology

## 2023-04-29 NOTE — Telephone Encounter (Signed)
-----   Message from [name], MD sent at 04/28/2023  5:27 PM EDT ----- Continuation of obstructive sleep apnea therapy by CPAP is recommended. REM sleep dependent sleep apnea.   I will order a new auto titrating CPAP, set Between a pressure setting of 10 and a pressure setting of 17 cm water with 2 cm EPR ,heated humidifier and mask of choice

## 2023-04-29 NOTE — Telephone Encounter (Signed)
I called pt. I advised pt that Dr. Vickey Huger reviewed their sleep study results and found that pt has severe sleep apnea. Dr. Vickey Huger recommends that pt starts auto CPAP. I reviewed PAP compliance expectations with the pt. Pt is agreeable to starting a CPAP. I advised pt that an order will be sent to a DME, [facility], and Christoper Allegra will call the pt within about one week after they file with the pt's insurance. Christoper Allegra will show the pt how to use the machine, fit for masks, and troubleshoot the CPAP if needed. A follow up appt was made for insurance purposes with Dr. Vickey Huger on Aug 5 at 8:30 am. Pt verbalized understanding to arrive 15 minutes early and bring their CPAP. Pt verbalized understanding of results. Pt had no questions at this time but was encouraged to call back if questions arise. I have sent the order to [facility] and have received confirmation that they have received the order.

## 2023-07-12 ENCOUNTER — Ambulatory Visit (INDEPENDENT_AMBULATORY_CARE_PROVIDER_SITE_OTHER): Payer: Medicare Other | Admitting: Neurology

## 2023-07-12 ENCOUNTER — Encounter: Payer: Self-pay | Admitting: Neurology

## 2023-07-12 VITALS — BP 118/69 | HR 63 | Ht 73.0 in | Wt 227.0 lb

## 2023-07-12 DIAGNOSIS — G4733 Obstructive sleep apnea (adult) (pediatric): Secondary | ICD-10-CM

## 2023-07-12 DIAGNOSIS — Z947 Corneal transplant status: Secondary | ICD-10-CM

## 2023-07-12 NOTE — Progress Notes (Addendum)
Provider:  [name], MD  Primary Care Physician:  Charlane Ferretti, DO [address] [place] [address]     Referring Provider: Charlane Ferretti, Do [address] [place],  [place] [address]          Chief Complaint according to patient   Patient presents with:                HISTORY OF PRESENT ILLNESS:  Joel Brown is a 72 y.o. male patient who is here for a revisit on 07/12/2023 for his first revist on CPAP: Joel Brown underwent a home sleep test on 5/14 2024 ( watch pat) and he is here to follow-up on his newly issued CPAP for his first compliance visit.   He was referred by Dr. Charlane Ferretti  He likes his new machine because it has a heated hose and allows higher humidity levels without building up condensation water.  At his last visit his Epworth sleepiness score was endorsed at 10 /24 and his fatigue severity score is 17 / 63 points.   I had ordered a new RESMED autotitration CPAP device with a pressure setting  between a setting of 10 and 17 cm of water, with 2 cm EPR.    HST : His overall AHI was 30.4/h , his REM AHI 43/h, and his known REM apnea hypopnea index 26/h.  In supine sleep the AHI was more than double as high as in non- supine sleep and rose to 34.5/h from 17/h . There was associated snoring,  mean volume of 41 dB  which was present for about 50% of the total sleep time - but he did not have significant oxygen desaturations.  His compliance looks excellent:  100% of days 97% by time with an average of 5 hours 43 minutes.  His residual apnea hypopnea index is 2.5/h and his 95th percentile pressure  is 12.3 cm of water.  The residual apneas are of obstructive and central origin -in the same proportion.   He does have a higher than average air leak.  Today's fatigue severity score was reduced to 14 out of 63 points and his Epworth Sleepiness Scale was endorsed at 7 out of 24.  So both were slightly reduced in comparison to our last visit.   The  geriatric depression score was endorsed at 2 out of 15 points.  Medication has not changed since our last visit .      03-25-2023: I have the pleasure of seeing Joel Brown and a repeat visit today on this patient tested positive for a mild to moderate degree of sleep apnea that was not REM sleep dependent but strongly dependent on his sleep position.  His supine sleep apnea hypopnea index was 29.5/h and in nonsupine position 9.5/h so avoiding sleeping on the back is definitely important for this patient.  He did not have hypoxia of clinical significance he did have intermittent bradycardia also.  Based on the data I had suggested and ordered a CPAP with the settings between 10 and 16 cmH2O pressure and 2 cm expiratory relief.  I met him last when his CPAP was not yet 72 years old but it does now need to be replaced Medicare will require pressure "" new work home sleep test and since he has 2 sleep test in his past that both documented mild to moderate sleep apnea I expect that we will see the same.  Review of Systems: Out of a complete 14 system review, the patient complains of only the following symptoms, and all other reviewed systems are negative.:     How likely are you to doze in the following situations: 0 = not likely, 1 = slight chance, 2 = moderate chance, 3 = high chance   Sitting and Reading? Watching Television? Sitting inactive in a public place (theater or meeting)? As a passenger in a car for an hour without a break? Lying down in the afternoon when circumstances permit? Sitting and talking to someone? Sitting quietly after lunch without alcohol? In a car, while stopped for a few minutes in traffic?   Total = 7/ 24 points 14  FSS endorsed at 14 / 63 points.   Social History   Socioeconomic History   Marital status: Married    Spouse name: Candace   Number of children: 1   Years of education: BA   Highest education level: Not on file  Occupational  History   Not on file  Tobacco Use   Smoking status: Never   Smokeless tobacco: Never  Vaping Use   Vaping status: Never Used  Substance and Sexual Activity   Alcohol use: Yes    Comment: 1/month   Drug use: Never   Sexual activity: Not on file  Other Topics Concern   Not on file  Social History Narrative   2-3 cups caffeine per day (soda/coffee)   Right handed   Social Determinants of Health   Financial Resource Strain: Not on file  Food Insecurity: Unknown (12/09/2022)   Received from [facility] [employer] Insecurity    In the past 3 months, have you had to go without food for 24 hours, multiple times due to lack of resources?: Not on file  Transportation Needs: Unknown (12/09/2022)   Received from [facility], [facility]   Transportation Needs    In the past 3 months, has lack of transporation kept you from medical appointments or getting things you need that are essential to your health?: Not on file  Physical Activity: Not on file  Stress: Not on file  Social Connections: Unknown (01/28/2023)   Received from [facility], [facility]   Social Connections    In the past 3 months, do you feel that you lack companionship or social support?: Not on file    Family History  Problem Relation Age of Onset   Dementia Mother    Heart disease Father        aortic valve replacement in his 62s   Heart disease Sister        aortic valve dz   Heart murmur Sister    Pancreatic cancer Brother 67   Uterine cancer Maternal Aunt 34   Brain cancer Maternal Aunt 50 - 60   Lung cancer Maternal Uncle        both were smokers   Prostate cancer Maternal Uncle    Cancer Paternal Uncle        NOS   Cancer Paternal Grandmother    Bone cancer Cousin        maternal first cousin   Cancer Cousin        maternal first cousin with NOS cancer    Past Medical History:  Diagnosis Date   Arthritis    fingers, hip   Bilateral corneal abrasions    uses an ointment  daily   Family history of pancreatic cancer    Family history  of prostate cancer    Family history of uterine cancer    Heart murmur    Hyperlipidemia    Sleep apnea     Past Surgical History:  Procedure Laterality Date   EYE SURGERY Bilateral 2010   HERNIA REPAIR     TONSILLECTOMY     as a child   TOTAL HIP ARTHROPLASTY Left 03/14/2021   Procedure: LEFT TOTAL HIP ARTHROPLASTY ANTERIOR APPROACH;  Surgeon: Kathryne Hitch, MD;  Location: [facility];  Service: Orthopedics;  Laterality: Left;   WISDOM TOOTH EXTRACTION       Current Outpatient Medications on File Prior to Visit  Medication Sig Dispense Refill   atorvastatin (LIPITOR) 10 MG tablet Take 20 mg by mouth daily.     COVID-19 mRNA bivalent vaccine, Pfizer, (PFIZER COVID-19 VAC BIVALENT) injection Inject into the muscle. 0.3 mL 0   Glucosamine HCl 1500 MG TABS Take 3,000 mg by mouth daily. Dona     influenza vaccine adjuvanted (FLUAD) 0.5 ML injection Inject into the muscle. 0.5 mL 0   Multiple Vitamins-Minerals (MULTIVITAMIN WITH MINERALS) tablet Take 3 tablets by mouth daily. Package with a total of 6 pill   Omega 3 Co q 10/ phyto Probiotic     olmesartan (BENICAR) 40 MG tablet Take 40 mg by mouth daily.     sodium chloride (MURO 128) 5 % ophthalmic ointment Place 1 application into both eyes at bedtime.     Sodium Hyaluronate, oral, (HYALURONIC ACID PO) Take 3 capsules by mouth daily. With supplement     No current facility-administered medications on file prior to visit.    Allergies  Allergen Reactions   Iodine Itching    IV IODINE    Penicillins Other (See Comments)    unknown   Latex Rash     DIAGNOSTIC DATA (LABS, IMAGING, TESTING) - I reviewed patient records, labs, notes, testing and imaging myself where available.  Lab Results  Component Value Date   WBC 12.3 (H) 03/15/2021   HGB 14.2 03/15/2021   HCT 40.9 03/15/2021   MCV 92.1 03/15/2021   PLT 202 03/15/2021      Component Value Date/Time    NA 134 (L) 03/15/2021 0256   K 4.5 03/15/2021 0256   CL 104 03/15/2021 0256   CO2 24 03/15/2021 0256   GLUCOSE 152 (H) 03/15/2021 0256   BUN 20 03/15/2021 0256   CREATININE 1.30 (H) 03/15/2021 0256   CALCIUM 8.3 (L) 03/15/2021 0256   GFRNONAA >60 03/15/2021 0256   No results found for: "CHOL", "HDL", "LDLCALC", "LDLDIRECT", "TRIG", "CHOLHDL" No results found for: "HGBA1C" No results found for: "VITAMINB12" No results found for: "TSH"  PHYSICAL EXAM:  Today's Vitals   07/12/23 0754  BP: 118/69  Pulse: 63  Weight: 227 lb (103 kg)  Height: 6\' 1"  (1.854 m)   Body mass index is 29.95 kg/m.   Wt Readings from Last 3 Encounters:  07/12/23 227 lb (103 kg)  03/25/23 223 lb 6.4 oz (101.3 kg)  07/28/21 224 lb 8 oz (101.8 kg)     Ht Readings from Last 3 Encounters:  07/12/23 6\' 1"  (1.854 m)  03/25/23 6\' 1"  (1.854 m)  07/28/21 6' 1.5" (1.867 m)      General: The patient is awake, alert and appears not in acute distress. The patient is well groomed. Head: Normocephalic, atraumatic. Neck is supple. Mallampati 3,  neck circumference:17 inches . Nasal airflow was today patent.  Retrognathia is not seen.  Dental status: biologica Cardiovascular:  Regular rate and cardiac rhythm by pulse,  without distended neck veins. Respiratory: Lungs are clear to auscultation.  Skin:  Without evidence of ankle edema, or rash. Trunk: The patient's posture is erect.   Neurologic exam : The patient is awake and alert, oriented to place and time.   Memory subjective described as intact.  Attention span & concentration ability appears normal.  Speech is fluent,  without  dysarthria, dysphonia or aphasia.  Mood and affect are appropriate.   Cranial nerves: no loss of smell or taste reported  Pupils are equal and briskly reactive to light. Funduscopic exam deferred. .  Extraocular movements in vertical and horizontal planes were  with a left sided  nystagmus. No Diplopia. Visual fields by  finger perimetry are intact. Hearing was intact to soft voice and finger rubbing.    Facial sensation intact to fine touch.  Facial motor strength is symmetric and tongue and uvula move midline.  Neck ROM : rotation, tilt and flexion extension were normal for age and shoulder shrug was symmetrical.    Motor exam:  Symmetric bulk, tone and ROM.   Normal tone without cog- wheeling, symmetric grip strength .  ASSESSMENT AND PLAN 14 - year -old -male  OSA patient , seen here happy on CPAP overall, but having some nasal airflow difficulties.      1) Diagnosis is that of OSA,  moderate-severe at AHI 30.4/h by AASM criteria.  Doing well on a new auto-CPAP device  and is highly compliant.  Reduction in ESS and FSS is documented.    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   [facility] will be asked to refit him for another mask that prevent air-leaking into the eyes.   I plan to follow up either personally or through our NP within 12 months as we have to document compliance for his Medicare insurer.    I would like to thank Charlane Ferretti, DO , for the referral.   After spending a total time of  20  minutes face to face and additional time for physical and neurologic examination, review of laboratory studies,  personal review of imaging studies, reports and results of other testing and review of referral information / records as far as provided in visit,   Electronically signed by: [name], MD 07/12/2023 8:34 AM  [facility] Board certified by The [employer] of Sleep Medicine and Diplomate of the [employer] of Sleep Medicine. Board certified In Neurology through the ABPN, Fellow of the [employer] of Neu[employer].

## 2023-07-12 NOTE — Addendum Note (Signed)
Addended by: [name] on: 07/12/2023 08:59 AM   Modules accepted: Orders

## 2023-09-09 ENCOUNTER — Institutional Professional Consult (permissible substitution) (INDEPENDENT_AMBULATORY_CARE_PROVIDER_SITE_OTHER): Payer: Medicare Other | Admitting: Otolaryngology

## 2023-09-10 NOTE — Progress Notes (Unsigned)
   Rubin Payor, PhD, LAT, ATC acting as a scribe for Clementeen Graham, MD.   Joel Brown is a 72 y.o. male who presents to [facility] today for R calf pain x ***. Pt locates pain to ***  R LE swelling: Aggravates: Treatments tried:  Pertinent review of systems: ***  Relevant historical information: ***   Exam:  There were no vitals taken for this visit. General: Well Developed, well nourished, and in no acute distress.   MSK: ***    Lab and Radiology Results No results found for this or any previous visit (from the past 72 hour(s)). No results found.     Assessment and Plan: 72 y.o. male with ***   PDMP not reviewed this encounter. No orders of the defined types were placed in this encounter.  No orders of the defined types were placed in this encounter.    Discussed warning signs or symptoms. Please see discharge instructions. Patient expresses understanding.   ***

## 2023-09-13 ENCOUNTER — Ambulatory Visit (INDEPENDENT_AMBULATORY_CARE_PROVIDER_SITE_OTHER): Payer: Medicare Other | Admitting: Family Medicine

## 2023-09-13 ENCOUNTER — Other Ambulatory Visit: Payer: Self-pay

## 2023-09-13 VITALS — BP 134/82 | HR 68 | Ht 73.0 in | Wt 232.0 lb

## 2023-09-13 DIAGNOSIS — M79661 Pain in right lower leg: Secondary | ICD-10-CM | POA: Diagnosis not present

## 2023-09-13 NOTE — Patient Instructions (Addendum)
Thank you for coming in today.   Work on the calf exercises. View at www.my-exercise-code.com using code: C56ZGGG  Use the compression sleeve.   Recheck in about 1 month.   I can add formal PT.   Use the band or body weight to go from up to down slowly 10-30 reps 1-3x daily.

## 2023-09-24 ENCOUNTER — Telehealth: Payer: Self-pay

## 2023-09-24 NOTE — Telephone Encounter (Signed)
Patient is interested in getting the physical therapy for his calf to [facility] that is who he has seen in the past.

## 2023-09-27 ENCOUNTER — Other Ambulatory Visit: Payer: Self-pay

## 2023-09-27 DIAGNOSIS — M79661 Pain in right lower leg: Secondary | ICD-10-CM

## 2023-09-27 NOTE — Telephone Encounter (Signed)
Referral placed. Pt left VM informing him.

## 2023-09-30 ENCOUNTER — Ambulatory Visit (INDEPENDENT_AMBULATORY_CARE_PROVIDER_SITE_OTHER): Payer: Medicare Other | Admitting: Physical Therapy

## 2023-09-30 ENCOUNTER — Encounter: Payer: Self-pay | Admitting: Physical Therapy

## 2023-09-30 ENCOUNTER — Other Ambulatory Visit: Payer: Self-pay

## 2023-09-30 DIAGNOSIS — R600 Localized edema: Secondary | ICD-10-CM

## 2023-09-30 DIAGNOSIS — M25671 Stiffness of right ankle, not elsewhere classified: Secondary | ICD-10-CM | POA: Diagnosis not present

## 2023-09-30 DIAGNOSIS — M25571 Pain in right ankle and joints of right foot: Secondary | ICD-10-CM | POA: Diagnosis not present

## 2023-09-30 DIAGNOSIS — M6281 Muscle weakness (generalized): Secondary | ICD-10-CM

## 2023-09-30 DIAGNOSIS — R2689 Other abnormalities of gait and mobility: Secondary | ICD-10-CM

## 2023-09-30 NOTE — Therapy (Signed)
OUTPATIENT PHYSICAL THERAPY LOWER EXTREMITY EVALUATION   Patient Name: Joel Brown MRN: [id number] DOB:12-15-1950, 72 y.o., male Today's Date: 09/30/2023  END OF SESSION:  PT End of Session - 09/30/23 1428     Visit Number 1    Number of Visits 7    Date for PT Re-Evaluation 11/12/23    Authorization Type MEDICARE & [employer]    Progress Note Due on Visit 10    PT Start Time 0804    PT Stop Time 0845    PT Time Calculation (min) 41 min    Activity Tolerance Patient tolerated treatment well    Behavior During Therapy WFL for tasks assessed/performed             Past Medical History:  Diagnosis Date   Arthritis    fingers, hip   Bilateral corneal abrasions    uses an ointment daily   Family history of pancreatic cancer    Family history of prostate cancer    Family history of uterine cancer    Heart murmur    Hyperlipidemia    Sleep apnea    Past Surgical History:  Procedure Laterality Date   EYE SURGERY Bilateral 2010   HERNIA REPAIR     TONSILLECTOMY     as a child   TOTAL HIP ARTHROPLASTY Left 03/14/2021   Procedure: LEFT TOTAL HIP ARTHROPLASTY ANTERIOR APPROACH;  Surgeon: Kathryne Hitch, MD;  Location: [facility];  Service: Orthopedics;  Laterality: Left;   WISDOM TOOTH EXTRACTION     Patient Active Problem List   Diagnosis Date Noted   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   Genetic testing 04/21/2023   Family history of uterine cancer 04/06/2023   Family history of prostate cancer 04/06/2023   Family history of pancreatic cancer 04/06/2023   OSA on CPAP 07/28/2021   Status post total replacement of left hip 03/14/2021   Unilateral primary osteoarthritis, left hip 01/15/2021    PCP: Charlane Ferretti, DO  REFERRING PROVIDER: Rodolph Bong, MD  REFERRING DIAG: [phone] (ICD-10-CM) - Right calf pain   THERAPY DIAG:  Pain in right ankle and joints of right foot  Stiffness of right ankle, not elsewhere classified  Muscle  weakness (generalized)  Localized edema  Other abnormalities of gait and mobility  Rationale for Evaluation and Treatment: Rehabilitation  ONSET DATE: 09/08/2023 date of injury  SUBJECTIVE:   SUBJECTIVE STATEMENT: On 09/07/2022 patient was playing pickleball with right calf pain following push off.  He has RICE method for care but pain continued.  He has been doing the exercises Dr. [name] gave him on 09/13/2023 but not consistently. He has not been able to play pickleball but has started walking with limp.   PERTINENT HISTORY: Left THA 2022, arthritis, heart murmur, HLD  PAIN:  NPRS scale: today 1/10 and in last week 0/10 - 2/10 Pain location: right calf more in medial calf Pain description: dull ache Aggravating factors: exercises of plantarflexion Relieving factors: stop exercises  PRECAUTIONS: None  WEIGHT BEARING RESTRICTIONS: No  FALLS:  Has patient fallen in last 6 months? 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  DIAGNOSTIC FINDINGS: 09/13/2023 Diagnostic Limited MSK Ultrasound of: Right medial calf.  Rounded appearance of the medial gastrocnemius muscle tendinous junction with area of hypoechoic fluid collection consistent with calf strain or small tear.  PATIENT SURVEYS:  FOTO intake:  57%  predicted:  72%  COGNITION: Overall cognitive status: WFL    SENSATION: WFL  EDEMA:  Circumferential: calf mid-gastroc Right 32 cm Left 30.5 cm  POSTURE: weight shift  left  PALPATION: Tenderness to palpation medial gastroc belly to tendon  LOWER EXTREMITY ROM:   ROM Right eval Left eval  Hip flexion    Hip extension    Hip abduction    Hip adduction    Hip internal rotation    Hip external rotation    Knee flexion    Knee extension    Ankle dorsiflexion Supine P:  Knee ext -3* Knee flex 13* Seated A: 12 A: 22  Ankle plantarflexion    Ankle inversion    Ankle eversion     (Blank rows = not tested)  LOWER EXTREMITY MMT:  MMT Right eval Left eval  Hip flexion 5/5 4/5  Hip extension 5/5 4/5  Hip abduction 5/5 4/5  Hip adduction    Hip internal rotation 5/5 4/5  Hip external rotation 5/5 4/5  Knee flexion    Knee extension    Ankle dorsiflexion 4/5   Ankle plantarflexion 3-/5 standing 4/5 standing  Ankle inversion    Ankle eversion     (Blank rows = not tested)  FUNCTIONAL TESTS:  Lt SLS:  4.53 sec Rt SLS:  12.1 sec  GAIT: Distance walked: >200' Assistive device utilized: None Level of assistance: SBA / verbal cues only Gait velocity: 3.40 ft/sec Comments: antalgic pattern with decreased stance duration right, RLE early heel rise and knee flexion, decreased step length left due to limited weight shift over right Increasing walking speed increases pain in right calf; Negotiating up ramp increased pain with greater difference in stance duration.  Patient reports descending ramp this summer to walking on level surfaces.   TODAY'S TREATMENT                                                                          DATE: 09/30/2023: Therex: HEP instruction/performance c cues for techniques, handout provided.  Trial set performed of each for comprehension and symptom assessment.  See below for exercise list  PATIENT EDUCATION:  Education details: HEP, POC Person educated: Patient Education method: Explanation, Demonstration, Verbal cues, and Handouts Education comprehension: verbalized understanding, returned  demonstration, and verbal cues required  HOME EXERCISE PROGRAM: Access Code: [email or web address] URL: [email or web address] Date: 09/30/2023 Prepared by: Vladimir Faster  Exercises - calf stretch on wall  - 3 x daily - 7 x weekly - 1 sets - 3 reps - 30 seconds hold - standing calf stretch with forefoot on small step or brick  - 3 x daily - 7 x weekly - 1 sets - 3 reps - 30 seconds hold - Standing Bilateral Gastroc Stretch with Step  - 3 x daily - 7 x weekly - 1  sets - 3 reps - 30 seconds hold - Gastroc Stretch on Step  - 3 x daily - 7 x weekly - 1 sets - 3 reps - 30 seconds hold  ASSESSMENT: CLINICAL IMPRESSION: Patient is a 72 y.o. who comes to clinic with complaints of right calf pain with mobility, strength and movement coordination deficits that impair their ability to perform usual daily and recreational functional activities without increase difficulty/symptoms at this time.  Patient to benefit from skilled PT services to address impairments and limitations to improve to previous level of function without restriction secondary to condition.  Patient has limited right dorsiflexion with activities that require need to be extended or close to full extension.  Patient goal is to also decrease risk of future injuries.  Patient has history of left total hip arthroplasty and has slight weakness in left hip that may account for increased risk of injuries.  OBJECTIVE IMPAIRMENTS: Abnormal gait, decreased activity tolerance, decreased balance, decreased knowledge of condition, decreased mobility, decreased ROM, decreased strength, increased edema, increased muscle spasms, and pain.   ACTIVITY LIMITATIONS: carrying, lifting, standing, stairs, and locomotion level  PARTICIPATION LIMITATIONS: community activity and recreational activities  PERSONAL FACTORS: Fitness and 1-2 comorbidities: see PMH  are also affecting patient's functional outcome.   REHAB POTENTIAL: Good  CLINICAL DECISION MAKING:  Stable/uncomplicated  EVALUATION COMPLEXITY: Low   GOALS: Goals reviewed with patient? Yes  SHORT TERM GOALS: (target date for Short term goals 10/08/2023)   1.  Patient will demonstrate independent use of home exercise program to maintain progress from in clinic treatments. Baseline: See objective data Goal status: Initial  2. PROM right ankle dorsiflexion with knee ext >10* Baseline: See objective data Goal status: Initial   LONG TERM GOALS: (target dates for all long term goals  11/12/2023 )   1. Patient will demonstrate/report no pain to facilitate minimal limitation in daily activity secondary to pain symptoms. Baseline: See objective data Goal status: Initial   2. Patient will demonstrate independent use of home exercise program to facilitate ability to maintain/progress functional gains from skilled physical therapy services. Baseline: See objective data Goal status: Initial   3. Patient will demonstrate FOTO outcome > or = 72 % to indicate reduced disability due to condition. Baseline: See objective data Goal status: Initial   4.  Patient will demonstrate right plantarflexion & left hip LE MMT 5/5 throughout to faciltiate usual transfers, stairs, squatting at [facility] for daily life.  Baseline: See objective data Goal status: Initial   5.  Patient ambulates >500' including ability to pick up pace, negotiates ramps & stairs without device independently without pain in right calf.  Baseline: See objective data Goal status: Initial   PLAN:  PT FREQUENCY: 1x/week  PT DURATION: 6 weeks  PLANNED INTERVENTIONS: 97164- PT Re-evaluation, 97110-Therapeutic exercises, 97530- Therapeutic activity, 97112- Neuromuscular re-education, 97535- Self Care, 14782- Manual therapy, [id number]- Gait training, 97014- Electrical stimulation (unattended), [id number]- Electrical stimulation (manual), 97016- Vasopneumatic device, 97033- Ionotophoresis 4mg /ml Dexamethasone, Patient/Family education,  Balance training, Stair training, Taping, Dry Needling, and Joint mobilization  PLAN FOR NEXT SESSION: Review HEP of stretches, progress HEP to include strength for right gastroc and left hip, consider dry needling for pain.   Vladimir Faster, PT, DPT 09/30/2023, 2:32 PM

## 2023-10-05 ENCOUNTER — Ambulatory Visit (INDEPENDENT_AMBULATORY_CARE_PROVIDER_SITE_OTHER): Payer: Medicare Other | Admitting: Rehabilitative and Restorative Service Providers"

## 2023-10-05 DIAGNOSIS — M25671 Stiffness of right ankle, not elsewhere classified: Secondary | ICD-10-CM | POA: Diagnosis not present

## 2023-10-05 DIAGNOSIS — M25571 Pain in right ankle and joints of right foot: Secondary | ICD-10-CM | POA: Diagnosis not present

## 2023-10-05 DIAGNOSIS — M6281 Muscle weakness (generalized): Secondary | ICD-10-CM | POA: Diagnosis not present

## 2023-10-05 DIAGNOSIS — R600 Localized edema: Secondary | ICD-10-CM | POA: Diagnosis not present

## 2023-10-05 DIAGNOSIS — R2689 Other abnormalities of gait and mobility: Secondary | ICD-10-CM

## 2023-10-05 NOTE — Therapy (Signed)
OUTPATIENT PHYSICAL THERAPY TREATMENT   Patient Name: Joel Brown MRN: [id number] DOB:[birth year], 72 y.o., male Today's Date: 10/05/2023  END OF SESSION:  PT End of Session - 10/05/23 1634     Visit Number 2    Number of Visits 7    Date for PT Re-Evaluation 11/12/23    Authorization Type MEDICARE & [employer] SENIOR C[employer] SUPP    Progress Note Due on Visit 10    PT Start Time 1602    PT Stop Time 1643    PT Time Calculation (min) 41 min    Activity Tolerance Patient tolerated treatment well    Behavior During Therapy WFL for tasks assessed/performed              Past Medical History:  Diagnosis Date   Arthritis    fingers, hip   Bilateral corneal abrasions    uses an ointment daily   Family history of pancreatic cancer    Family history of prostate cancer    Family history of uterine cancer    Heart murmur    Hyperlipidemia    Sleep apnea    Past Surgical History:  Procedure Laterality Date   EYE SURGERY Bilateral 2010   HERNIA REPAIR     TONSILLECTOMY     as a child   TOTAL HIP ARTHROPLASTY Left 03/14/2021   Procedure: LEFT TOTAL HIP ARTHROPLASTY ANTERIOR APPROACH;  Surgeon: Kathryne Hitch, MD;  Location: [facility];  Service: Orthopedics;  Laterality: Left;   WISDOM TOOTH EXTRACTION     Patient Active Problem List   Diagnosis Date Noted   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   Genetic testing 04/21/2023   Family history of uterine cancer 04/06/2023   Family history of prostate cancer 04/06/2023   Family history of pancreatic cancer 04/06/2023   OSA on CPAP 07/28/2021   Status post total replacement of left hip 03/14/2021   Unilateral primary osteoarthritis, left hip 01/15/2021    PCP: Charlane Ferretti, DO  REFERRING PROVIDER: Rodolph Bong, MD  REFERRING DIAG: [phone] (ICD-10-CM) - Right calf pain   THERAPY DIAG:  Pain in right ankle and joints of right foot  Stiffness of right ankle, not elsewhere classified  Muscle weakness  (generalized)  Localized edema  Other abnormalities of gait and mobility  Rationale for Evaluation and Treatment: Rehabilitation  ONSET DATE: 09/08/2023 date of injury  SUBJECTIVE:   SUBJECTIVE STATEMENT: Pt indicated feeling like he can walk around and move better.    PERTINENT HISTORY: Left THA 2022, arthritis, heart murmur, HLD  PAIN:  NPRS scale: no pain upon arrival with daily activity Pain location: right calf more in medial calf Pain description: dull ache Aggravating factors: exercises of plantarflexion Relieving factors: stop exercises  PRECAUTIONS: None  WEIGHT BEARING RESTRICTIONS: No  FALLS:  Has patient fallen in last 6 months? 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  DIAGNOSTIC FINDINGS: 09/13/2023 Diagnostic Limited MSK Ultrasound of: Right medial calf.  Rounded appearance of the medial gastrocnemius  muscle tendinous junction with area of hypoechoic fluid collection consistent with calf strain or small tear.  PATIENT SURVEYS:  09/30/2023 FOTO intake:  57%  predicted:  72%  COGNITION: 09/30/2023 Overall cognitive status: WFL    SENSATION: 09/30/2023 WFL  EDEMA:  09/30/2023 Circumferential: calf mid-gastroc Right 32 cm Left 30.5 cm  POSTURE:  09/30/2023 weight shift left  PALPATION: 09/30/2023 Tenderness to palpation medial gastroc belly to tendon  LOWER EXTREMITY ROM:   ROM Right 09/30/2023 Left 09/30/2023  Hip flexion    Hip extension     Hip abduction    Hip adduction    Hip internal rotation    Hip external rotation    Knee flexion    Knee extension    Ankle dorsiflexion Supine P:  Knee ext -3* Knee flex 13* Seated A: 12 A: 22  Ankle plantarflexion    Ankle inversion    Ankle eversion     (Blank rows = not tested)  LOWER EXTREMITY MMT:  MMT Right 09/30/2023 Left 09/30/2023 Right 10/05/2023 Left 10/05/2023  Hip flexion 5/5 4/5    Hip extension 5/5 4/5    Hip abduction 5/5 4/5    Hip adduction      Hip internal rotation 5/5 4/5    Hip external rotation 5/5 4/5    Knee flexion      Knee extension      Ankle dorsiflexion 4/5  5/5   Ankle plantarflexion 3-/5 standing 4/5 standing    Ankle inversion   5/5   Ankle eversion   5/5    (Blank rows = not tested)  FUNCTIONAL TESTS:  09/30/2023 Lt SLS:  4.53 sec Rt SLS:  12.1 sec  GAIT: 09/30/2023 Distance walked: >200' Assistive device utilized: None Level of assistance: SBA / verbal cues only Gait velocity: 3.40 ft/sec Comments: antalgic pattern with decreased stance duration right, RLE early heel rise and knee flexion, decreased step length left due to limited weight shift over right Increasing walking speed increases pain in right calf; Negotiating up ramp increased pain with greater difference in stance duration.  Patient reports descending ramp this summer to walking on level surfaces.                   TODAY'S TREATMENT                                                                          DATE:10/05/2023: Therex: HEP review c verbal cues for techniques.   Spent additional time in verbal education about pain monitoring scale with handout with green (0-2/10), yellow (2-4), red (5/10).   Standing double leg PF with Rt leg lowering focus (majority of weight) x 10 (held from HEP) Long sitting. Rt ankle PF blue band x 15/20 with HEP cues Supine bridge c blue band hip abduction hold 2-3 sec x 10 Sidelying Lt hip clam shell blue band x 10 c slow  eccentric focus   Manual: Percussive device medial calf trigger points  TODAY'S TREATMENT  DATE:09/30/2023: Therex: HEP instruction/performance c cues for techniques, handout provided.  Trial set performed of each for comprehension and symptom assessment.  See below for exercise list  PATIENT EDUCATION:  09/30/2023 Education details: HEP, POC Person educated: Patient Education method: Explanation, Demonstration, Verbal cues, and Handouts Education comprehension: verbalized understanding, returned demonstration, and verbal cues required  HOME EXERCISE PROGRAM: Access Code: [email or web address] URL: [email or web address] Date: 10/05/2023 Prepared by: Chyrel Masson  Exercises - calf stretch on wall  - 3 x daily - 7 x weekly - 1 sets - 3 reps - 30 seconds hold - standing calf stretch with forefoot on small step or brick  - 3 x daily - 7 x weekly - 1 sets - 3 reps - 30 seconds hold - Standing Bilateral Gastroc Stretch with Step  - 3 x daily - 7 x weekly - 1 sets - 3 reps - 30 seconds hold - Gastroc Stretch on Step  - 3 x daily - 7 x weekly - 1 sets - 3 reps - 30 seconds hold - Long Sitting Ankle Plantar Flexion with Resistance  - 2 x daily - 7 x weekly - 3 sets - 10-15 reps - Supine Bridge with Resistance Band  - 1-2 x daily - 7 x weekly - 2-3 sets - 10-15 reps - 2-3 hold - Clamshell with Resistance  - 1-2 x daily - 7 x weekly - 2-3 sets - 10-15 reps  ASSESSMENT: CLINICAL IMPRESSION: Medial calf tightness noted but reduced in severity compared to previous visit.  Discussed pain monitoring scale and return to sport activity pending symptom response.  Discussed dry needling but held today due to general improvements.  Percussive device used for similar goals of myofascial loosening.  Limitations in WB PF still noted.  Continued skilled PT services warranted.    OBJECTIVE IMPAIRMENTS: Abnormal gait, decreased  activity tolerance, decreased balance, decreased knowledge of condition, decreased mobility, decreased ROM, decreased strength, increased edema, increased muscle spasms, and pain.   ACTIVITY LIMITATIONS: carrying, lifting, standing, stairs, and locomotion level  PARTICIPATION LIMITATIONS: community activity and recreational activities  PERSONAL FACTORS: Fitness and 1-2 comorbidities: see PMH  are also affecting patient's functional outcome.   REHAB POTENTIAL: Good  CLINICAL DECISION MAKING: Stable/uncomplicated  EVALUATION COMPLEXITY: Low   GOALS: Goals reviewed with patient? Yes  SHORT TERM GOALS: (target date for Short term goals 10/08/2023)   1.  Patient will demonstrate independent use of home exercise program to maintain progress from in clinic treatments. Baseline: See objective data Goal status: on going 10/05/2023  2. PROM right ankle dorsiflexion with knee ext >10* Baseline: See objective data Goal status: on going 10/05/2023   LONG TERM GOALS: (target dates for all long term goals  11/12/2023 )   1. Patient will demonstrate/report no pain to facilitate minimal limitation in daily activity secondary to pain symptoms. Baseline: See objective data Goal status: Initial   2. Patient will demonstrate independent use of home exercise program to facilitate ability to maintain/progress functional gains from skilled physical therapy services. Baseline: See objective data Goal status: Initial   3. Patient will demonstrate FOTO outcome > or = 72 % to indicate reduced disability due to condition. Baseline: See objective data Goal status: Initial   4.  Patient will demonstrate right plantarflexion & left hip LE MMT 5/5 throughout to faciltiate usual transfers, stairs, squatting at [facility] for daily life.  Baseline: See objective data Goal status: Initial   5.  Patient ambulates >500' including ability to pick up pace,  negotiates ramps & stairs without device independently  without pain in right calf.  Baseline: See objective data Goal status: Initial   PLAN:  PT FREQUENCY: 1x/week  PT DURATION: 6 weeks  PLANNED INTERVENTIONS: 97164- PT Re-evaluation, 97110-Therapeutic exercises, 97530- Therapeutic activity, [id number]- Neuromuscular re-education, 97535- Self Care, 29562- Manual therapy, [id number]- Gait training, 97014- Electrical stimulation (unattended), [id number]- Electrical stimulation (manual), 97016- Vasopneumatic device, [id number]- Ionotophoresis 4mg /ml Dexamethasone, Patient/Family education, Balance training, Stair training, Taping, Dry Needling, and Joint mobilization  PLAN FOR NEXT SESSION: Percussive device/needling as necessary.  Recheck activity tolerance reporting.    Chyrel Masson, PT, DPT, OCS, ATC 10/05/23  4:46 PM

## 2023-10-07 ENCOUNTER — Encounter (INDEPENDENT_AMBULATORY_CARE_PROVIDER_SITE_OTHER): Payer: Self-pay | Admitting: Otolaryngology

## 2023-10-07 ENCOUNTER — Ambulatory Visit (INDEPENDENT_AMBULATORY_CARE_PROVIDER_SITE_OTHER): Payer: Medicare Other | Admitting: Otolaryngology

## 2023-10-07 VITALS — BP 156/83 | HR 76 | Ht 74.0 in | Wt 234.0 lb

## 2023-10-07 DIAGNOSIS — G4733 Obstructive sleep apnea (adult) (pediatric): Secondary | ICD-10-CM | POA: Diagnosis not present

## 2023-10-07 DIAGNOSIS — J343 Hypertrophy of nasal turbinates: Secondary | ICD-10-CM

## 2023-10-07 DIAGNOSIS — J3089 Other allergic rhinitis: Secondary | ICD-10-CM

## 2023-10-07 DIAGNOSIS — R040 Epistaxis: Secondary | ICD-10-CM

## 2023-10-07 DIAGNOSIS — R0981 Nasal congestion: Secondary | ICD-10-CM | POA: Diagnosis not present

## 2023-10-07 DIAGNOSIS — J342 Deviated nasal septum: Secondary | ICD-10-CM

## 2023-10-07 DIAGNOSIS — J3489 Other specified disorders of nose and nasal sinuses: Secondary | ICD-10-CM

## 2023-10-07 MED ORDER — SALINE SPRAY 0.65 % NA SOLN: 1.0000 | NASAL | 5 refills | Status: AC | PRN

## 2023-10-07 MED ORDER — MOMETASONE FUROATE 50 MCG/ACT NA SUSP: 2.0000 | Freq: Every day | NASAL | 12 refills | Status: AC

## 2023-10-07 MED ORDER — CETIRIZINE HCL 10 MG PO TABS: 10.0000 mg | ORAL_TABLET | Freq: Every day | ORAL | 11 refills | Status: AC
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
## 2023-10-07 NOTE — Progress Notes (Signed)
   I, [name], CMA acting as a scribe for Joel Graham, MD.  Joel Brown is a 72 y.o. male who presents to [facility] today for f/u R calf pain. Pt was last seen by Dr. [name] on 09/13/23 and was advised to cont compression and was taught HEP. Pt later called the office requesting a PT referral, completing 2 visits.  Today, pt reports improvement of sx since last visit. PT has been helpful. Compliant with HEP. Denies new or worsening sx since last visit. Was able to walk with grandchildren to trick-or-treat last night. PT decided told hold off on dry-needling, recommended massage gun instead. Interested in returning to [employer].    Pertinent review of systems: No fevers or chills  Relevant historical information: Sleep apnea.  History left hip replacement.   Exam:  BP 136/82   Pulse 80   Ht 6\' 2"  (1.88 m)   Wt 232 lb (105.2 kg)   SpO2 96%   BMI 29.79 kg/m  General: Well Developed, well nourished, and in no acute distress.   MSK: Right calf slight swelling.  Normal-appearing otherwise. Tender palpation minimally at medial gastrocnemius muscle tendinous junction. Normal foot and ankle motion. Intact strength.    Lab and Radiology Results  Diagnostic Limited MSK Ultrasound of: Right gastrocnemius Slight hypoechoic fluid collecting deep to the gastrocnemius at the gastrocnemius muscle tendinous junction medial head.  This is improved from prior ultrasound. Impression: Continued hematoma      Assessment and Plan: 72 y.o. male with right calf tear.  Overall symptoms are improving.  Appearance ultrasound is also improving.  Plan to continue home exercise program calf compression sleeve and physical therapy.  If not resolved in 1 month recheck.  We talked about graduated return to play.   PDMP not reviewed this encounter. Orders Placed This Encounter  Procedures   [place] LIMITED JOINT SPACE STRUCTURES LOW RIGHT(NO LINKED CHARGES)     Order Specific Question:   Reason for Exam (SYMPTOM  OR DIAGNOSIS REQUIRED)    Answer:   eval rt calf    Order Specific Question:   Preferred imaging location?    Answer:   [facility]   No orders of the defined types were placed in this encounter.    Discussed warning signs or symptoms. Please see discharge instructions. Patient expresses understanding.   The above documentation has been reviewed and is accurate and complete Joel Brown, M.D.

## 2023-10-07 NOTE — Patient Instructions (Addendum)
Epistaxis prevention instructions given to the patient: - use nasal saline spray x6/day and Vaseline twice a day to prevent nose bleeds - for active nose bleeds use Afrin and nasal tip pressure x 10 min to stop them - if nose bleed does not stop with above measures, please go to Emergency Room  - please see your primary care provider to check your blood pressure and make sure it is under control - return for recurrent nose bleeds and we will consider cautery of your nasal blood vessels  - Purchase BleedStop to have at home and help with epistaxis control   - start nasal steroid spray (Nasonex) twice daily - start Zyrtec daily  - use Ocean spray  - return as needed

## 2023-10-08 ENCOUNTER — Other Ambulatory Visit: Payer: Self-pay

## 2023-10-08 ENCOUNTER — Encounter: Payer: Self-pay | Admitting: Family Medicine

## 2023-10-08 ENCOUNTER — Ambulatory Visit (INDEPENDENT_AMBULATORY_CARE_PROVIDER_SITE_OTHER): Payer: Medicare Other | Admitting: Family Medicine

## 2023-10-08 VITALS — BP 136/82 | HR 80 | Ht 74.0 in | Wt 232.0 lb

## 2023-10-08 DIAGNOSIS — M79661 Pain in right lower leg: Secondary | ICD-10-CM | POA: Diagnosis not present

## 2023-10-08 NOTE — Patient Instructions (Signed)
Thank you for coming in today.    

## 2023-10-12 ENCOUNTER — Ambulatory Visit (INDEPENDENT_AMBULATORY_CARE_PROVIDER_SITE_OTHER): Payer: Medicare Other | Admitting: Rehabilitative and Restorative Service Providers"

## 2023-10-12 ENCOUNTER — Encounter: Payer: Self-pay | Admitting: Rehabilitative and Restorative Service Providers"

## 2023-10-12 DIAGNOSIS — M25671 Stiffness of right ankle, not elsewhere classified: Secondary | ICD-10-CM | POA: Diagnosis not present

## 2023-10-12 DIAGNOSIS — M25571 Pain in right ankle and joints of right foot: Secondary | ICD-10-CM

## 2023-10-12 DIAGNOSIS — R600 Localized edema: Secondary | ICD-10-CM

## 2023-10-12 DIAGNOSIS — M6281 Muscle weakness (generalized): Secondary | ICD-10-CM | POA: Diagnosis not present

## 2023-10-12 DIAGNOSIS — R2689 Other abnormalities of gait and mobility: Secondary | ICD-10-CM

## 2023-10-12 NOTE — Therapy (Signed)
OUTPATIENT PHYSICAL THERAPY TREATMENT   Patient Name: Joel Brown MRN: [id number] DOB:Mar 27, 1951, 72 y.o., male Today's Date: 10/12/2023  END OF SESSION:  PT End of Session - 10/12/23 0804     Visit Number 3    Number of Visits 7    Date for PT Re-Evaluation 11/12/23    Authorization Type MEDICARE & [employer]ET[employer] SENIOR CARE SUPP    Progress Note Due on Visit 10    PT Start Time 0800    PT Stop Time 0840    PT Time Calculation (min) 40 min    Activity Tolerance Patient tolerated treatment well    Behavior During Therapy WFL for tasks assessed/performed               Past Medical History:  Diagnosis Date   Arthritis    fingers, hip   Bilateral corneal abrasions    uses an ointment daily   Family history of pancreatic cancer    Family history of prostate cancer    Family history of uterine cancer    Heart murmur    Hyperlipidemia    Sleep apnea    Past Surgical History:  Procedure Laterality Date   EYE SURGERY Bilateral 2010   HERNIA REPAIR     TONSILLECTOMY     as a child   TOTAL HIP ARTHROPLASTY Left 03/14/2021   Procedure: LEFT TOTAL HIP ARTHROPLASTY ANTERIOR APPROACH;  Surgeon: Kathryne Hitch, MD;  Location: [facility];  Service: Orthopedics;  Laterality: Left;   WISDOM TOOTH EXTRACTION     Patient Active Problem List   Diagnosis Date Noted   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   Genetic testing 04/21/2023   Family history of uterine cancer 04/06/2023   Family history of prostate cancer 04/06/2023   Family history of pancreatic cancer 04/06/2023   OSA on CPAP 07/28/2021   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    Pt indicated no complaints of pain in daily activity since last visit.   PERTINENT HISTORY: Left THA 2022, arthritis, heart murmur, HLD  PAIN:  NPRS scale: no pain upon arrival with daily activity Pain location: right calf more in medial calf Pain description: dull ache Aggravating factors: exercises of plantarflexion Relieving factors: stop exercises  PRECAUTIONS: None  WEIGHT BEARING RESTRICTIONS: No  FALLS:  Has patient fallen in last 6 months? 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  DIAGNOSTIC FINDINGS: 09/13/2023 Diagnostic Limited  MSK Ultrasound of: Right medial calf.  Rounded appearance of the medial gastrocnemius muscle tendinous junction with area of hypoechoic fluid collection consistent with calf strain or small tear.  PATIENT SURVEYS:  09/30/2023 FOTO intake:  57%  predicted:  72%  COGNITION: 09/30/2023 Overall cognitive status: WFL    SENSATION: 09/30/2023 WFL  EDEMA:  09/30/2023 Circumferential: calf mid-gastroc Right 32 cm Left 30.5 cm  POSTURE:  09/30/2023 weight shift left  PALPATION: 09/30/2023 Tenderness to palpation medial gastroc belly to tendon  LOWER EXTREMITY ROM:   ROM  Right 09/30/2023 Left 09/30/2023  Hip flexion    Hip extension    Hip abduction    Hip adduction    Hip internal rotation    Hip external rotation    Knee flexion    Knee extension    Ankle dorsiflexion Supine P:  Knee ext -3* Knee flex 13* Seated A: 12 A: 22  Ankle plantarflexion    Ankle inversion    Ankle eversion     (Blank rows = not tested)  LOWER EXTREMITY MMT:  MMT Right 09/30/2023 Left 09/30/2023 Right 10/05/2023 Left 10/05/2023  Hip flexion 5/5 4/5    Hip extension 5/5 4/5    Hip abduction 5/5 4/5    Hip adduction      Hip internal rotation 5/5 4/5    Hip external rotation 5/5 4/5    Knee flexion      Knee extension      Ankle dorsiflexion 4/5  5/5   Ankle plantarflexion 3-/5 standing 4/5 standing    Ankle inversion   5/5   Ankle eversion   5/5    (Blank rows = not tested)  FUNCTIONAL TESTS:  09/30/2023 Lt SLS:  4.53 sec Rt SLS:  12.1 sec  GAIT: 09/30/2023 Distance walked: >200' Assistive device utilized: None Level of assistance: SBA / verbal cues only Gait velocity: 3.40 ft/sec Comments: antalgic pattern with decreased stance duration right, RLE early heel rise and knee flexion, decreased step length left due to limited weight shift over right Increasing walking speed increases pain in right calf; Negotiating up ramp increased pain with greater difference in stance duration.  Patient reports descending ramp this summer to walking on level surfaces.                   TODAY'S TREATMENT                                                                          DATE:10/12/2023: Therex: Standing heel raises x 15 double leg  Gastroc incline stretch 30 sec x 5  Advice on trial of hitting balls to get response symptoms.    Neuro Re-ed Lateral stepping 3 cones x 8 each , performed bilaterally  SLS with reactive blazepod light touching 3 anteriorly semicircle 30 sec x 4 bilaterally with 15 second rest breaks Tandem stance on foam 1 min x 2  bilateral with occasional HHA, min A by clinic correction for loss of balance    TODAY'S TREATMENT  DATE:10/05/2023: Therex: HEP review c verbal cues for techniques.   Spent additional time in verbal education about pain monitoring scale with handout with green (0-2/10), yellow (2-4), red (5/10).   Standing double leg PF with Rt leg lowering focus (majority of weight) x 10 (held from HEP) Long sitting. Rt ankle PF blue band x 15/20 with HEP cues Supine bridge c blue band hip abduction hold 2-3 sec x 10 Sidelying Lt hip clam shell blue band x 10 c slow eccentric focus   Manual: Percussive device medial calf trigger points Rt.   TODAY'S TREATMENT                                                                          DATE:09/30/2023: Therex: HEP instruction/performance c cues for techniques, handout provided.  Trial set performed of each for comprehension and symptom assessment.  See below for exercise list  PATIENT EDUCATION:  09/30/2023 Education details: HEP, POC Person educated: Patient Education method: Explanation, Demonstration, Verbal cues, and Handouts Education comprehension: verbalized understanding, returned demonstration, and verbal cues required  HOME EXERCISE PROGRAM: Access Code: [email or web address] URL: [email or web address] Date: 10/05/2023 Prepared by: Chyrel Masson  Exercises - calf stretch on wall  - 3 x daily - 7 x weekly - 1 sets - 3 reps - 30 seconds hold - standing calf stretch with forefoot on small step or brick  - 3 x daily - 7 x weekly - 1 sets - 3 reps - 30 seconds hold - Standing Bilateral Gastroc Stretch with Step  - 3 x daily - 7 x weekly - 1 sets - 3 reps - 30 seconds hold - Gastroc Stretch on Step  - 3 x daily - 7 x weekly - 1 sets - 3 reps - 30 seconds hold - Long Sitting Ankle Plantar Flexion with Resistance  - 2 x daily - 7 x weekly - 3 sets - 10-15 reps - Supine  Bridge with Resistance Band  - 1-2 x daily - 7 x weekly - 2-3 sets - 10-15 reps - 2-3 hold - Clamshell with Resistance  - 1-2 x daily - 7 x weekly - 2-3 sets - 10-15 reps  ASSESSMENT: CLINICAL IMPRESSION: Good overall response to controlled lateral movements in neuro activity today.  Continued improvement in walking tolerance reported between visits.  Pt has not participated in pickle ball activity and may try prior to next visit.    OBJECTIVE IMPAIRMENTS: Abnormal gait, decreased activity tolerance, decreased balance, decreased knowledge of condition, decreased mobility, decreased ROM, decreased strength, increased edema, increased muscle spasms, and pain.   ACTIVITY LIMITATIONS: carrying, lifting, standing, stairs, and locomotion level  PARTICIPATION LIMITATIONS: community activity and recreational activities  PERSONAL FACTORS: Fitness and 1-2 comorbidities: see PMH  are also affecting patient's functional outcome.   REHAB POTENTIAL: Good  CLINICAL DECISION MAKING: Stable/uncomplicated  EVALUATION COMPLEXITY: Low   GOALS: Goals reviewed with patient? Yes  SHORT TERM GOALS: (target date for Short term goals 10/08/2023)   1.  Patient will demonstrate independent use of home exercise program to maintain progress from in clinic treatments. Baseline: See objective data Goal status: on going 10/05/2023  2. PROM right ankle dorsiflexion with knee ext >10* Baseline: See objective data Goal status:  on going 10/05/2023   LONG TERM GOALS: (target dates for all long term goals  11/12/2023 )   1. Patient will demonstrate/report no pain to facilitate minimal limitation in daily activity secondary to pain symptoms. Baseline: See objective data Goal status: Initial   2. Patient will demonstrate independent use of home exercise program to facilitate ability to maintain/progress functional gains from skilled physical therapy services. Baseline: See objective data Goal status: Initial    3. Patient will demonstrate FOTO outcome > or = 72 % to indicate reduced disability due to condition. Baseline: See objective data Goal status: Initial   4.  Patient will demonstrate right plantarflexion & left hip LE MMT 5/5 throughout to faciltiate usual transfers, stairs, squatting at [facility] for daily life.  Baseline: See objective data Goal status: Initial   5.  Patient ambulates >500' including ability to pick up pace, negotiates ramps & stairs without device independently without pain in right calf.  Baseline: See objective data Goal status: Initial   PLAN:  PT FREQUENCY: 1x/week  PT DURATION: 6 weeks  PLANNED INTERVENTIONS: 97164- PT Re-evaluation, 97110-Therapeutic exercises, 97530- Therapeutic activity, 97112- Neuromuscular re-education, 97535- Self Care, 16109- Manual therapy, [id number]- Gait training, 97014- Electrical stimulation (unattended), [id number]- Electrical stimulation (manual), 97016- Vasopneumatic device, 97033- Ionotophoresis 4mg /ml Dexamethasone, Patient/Family education, Balance training, Stair training, Taping, Dry Needling, and Joint mobilization  PLAN FOR NEXT SESSION: Percussive device/needling as necessary, sport related movement training as tolerated.    Chyrel Masson, PT, DPT, OCS, ATC 10/12/23  8:45 AM

## 2023-10-19 ENCOUNTER — Encounter: Payer: Self-pay | Admitting: Rehabilitative and Restorative Service Providers"

## 2023-10-19 ENCOUNTER — Ambulatory Visit (INDEPENDENT_AMBULATORY_CARE_PROVIDER_SITE_OTHER): Payer: Medicare Other | Admitting: Rehabilitative and Restorative Service Providers"

## 2023-10-19 DIAGNOSIS — M6281 Muscle weakness (generalized): Secondary | ICD-10-CM | POA: Diagnosis not present

## 2023-10-19 DIAGNOSIS — M25571 Pain in right ankle and joints of right foot: Secondary | ICD-10-CM | POA: Diagnosis not present

## 2023-10-19 DIAGNOSIS — M25671 Stiffness of right ankle, not elsewhere classified: Secondary | ICD-10-CM | POA: Diagnosis not present

## 2023-10-19 DIAGNOSIS — R600 Localized edema: Secondary | ICD-10-CM

## 2023-10-19 DIAGNOSIS — R2689 Other abnormalities of gait and mobility: Secondary | ICD-10-CM

## 2023-10-19 NOTE — Therapy (Addendum)
OUTPATIENT PHYSICAL THERAPY TREATMENT / DISCHARGE   Patient Name: Joel Brown MRN: [id number] DOB:[birth year], 72 y.o., male Today's Date: 10/19/2023  END OF SESSION:  PT End of Session - 10/19/23 0850     Visit Number 4    Number of Visits 7    Date for PT Re-Evaluation 11/12/23    Authorization Type MEDICARE & [employer]    Progress Note Due on Visit 10    PT Start Time [id number]    PT Stop Time 0922    PT Time Calculation (min) 39 min    Activity Tolerance Patient tolerated treatment well    Behavior During Therapy WFL for tasks assessed/performed                Past Medical History:  Diagnosis Date   Arthritis    fingers, hip   Bilateral corneal abrasions    uses an ointment daily   Family history of pancreatic cancer    Family history of prostate cancer    Family history of uterine cancer    Heart murmur    Hyperlipidemia    Sleep apnea    Past Surgical History:  Procedure Laterality Date   EYE SURGERY Bilateral 2010   HERNIA REPAIR     TONSILLECTOMY     as a child   TOTAL HIP ARTHROPLASTY Left 03/14/2021   Procedure: LEFT TOTAL HIP ARTHROPLASTY ANTERIOR APPROACH;  Surgeon: Kathryne Hitch, MD;  Location: [facility];  Service: Orthopedics;  Laterality: Left;   WISDOM TOOTH EXTRACTION     Patient Active Problem List   Diagnosis Date Noted   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   Genetic testing 04/21/2023   Family history of uterine cancer 04/06/2023   Family history of prostate cancer 04/06/2023   Family history of pancreatic cancer 04/06/2023   OSA on CPAP 07/28/2021   Status post total replacement of left hip 03/14/2021   Unilateral primary osteoarthritis, left hip 01/15/2021    PCP: Charlane Ferretti, DO  REFERRING PROVIDER: Rodolph Bong, MD  REFERRING DIAG: [phone] (ICD-10-CM) - Right calf pain   THERAPY DIAG:  Pain in right ankle and joints of right foot  Stiffness of right ankle, not elsewhere classified  Muscle  weakness (generalized)  Localized edema  Other abnormalities of gait and mobility  Rationale for Evaluation and Treatment: Rehabilitation  ONSET DATE: 09/08/2023 date of injury  SUBJECTIVE:   SUBJECTIVE STATEMENT: Pt indicated able to walk and go up and down stairs without complaints.  Reported some swelling in ankle still happen with prolonged sitting. Hasn't done pickle ball activity.   PERTINENT HISTORY: Left THA 2022, arthritis, heart murmur, HLD  PAIN:  NPRS scale: no pain upon arrival with daily activity Pain location: right calf more in medial calf Pain description: dull ache Aggravating factors: exercises of plantarflexion Relieving factors: stop exercises  PRECAUTIONS: None  WEIGHT BEARING RESTRICTIONS: No  FALLS:  Has patient fallen in last 6 months? 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  DIAGNOSTIC FINDINGS: 09/13/2023 Diagnostic Limited MSK Ultrasound of: Right medial calf.  Rounded appearance of the medial gastrocnemius muscle tendinous junction with area of hypoechoic fluid collection consistent with calf strain or small tear.  PATIENT SURVEYS:  10/19/2023:  FOTO 73% updated  09/30/2023 FOTO intake:  57%  predicted:  72%  COGNITION: 09/30/2023 Overall cognitive status: WFL    SENSATION: 09/30/2023 WFL  EDEMA:  09/30/2023 Circumferential: calf mid-gastroc Right 32 cm Left 30.5 cm  POSTURE:  09/30/2023 weight shift  left  PALPATION: 09/30/2023 Tenderness to palpation medial gastroc belly to tendon  LOWER EXTREMITY ROM:   ROM Right 09/30/2023 Left 09/30/2023  Hip flexion    Hip extension    Hip abduction    Hip adduction    Hip internal rotation    Hip external rotation    Knee flexion    Knee extension    Ankle dorsiflexion Supine P:  Knee ext -3* Knee flex 13* Seated A: 12 A: 22  Ankle plantarflexion    Ankle inversion    Ankle eversion     (Blank rows = not tested)  LOWER EXTREMITY MMT:  MMT Right 09/30/2023 Left 09/30/2023 Right 10/05/2023 Left 10/05/2023  Hip flexion 5/5 4/5    Hip extension 5/5 4/5    Hip abduction 5/5 4/5    Hip adduction      Hip internal rotation 5/5 4/5    Hip external rotation 5/5 4/5    Knee flexion      Knee extension      Ankle dorsiflexion 4/5  5/5   Ankle plantarflexion 3-/5 standing 4/5 standing    Ankle inversion   5/5   Ankle eversion   5/5    (Blank rows = not tested)  FUNCTIONAL TESTS:  09/30/2023 Lt SLS:  4.53 sec Rt SLS:  12.1 sec  GAIT: 09/30/2023 Distance walked: >200' Assistive device utilized: None Level of assistance: SBA / verbal cues only Gait velocity: 3.40 ft/sec Comments: antalgic pattern with decreased stance duration right, RLE early heel rise and knee flexion, decreased step length left due to limited weight shift over right Increasing walking speed increases pain in right calf; Negotiating up ramp increased pain with greater difference in stance duration.  Patient reports descending ramp this summer to walking on level surfaces.                   TODAY'S TREATMENT                                                                          DATE:10/19/2023: Therex: UBE LE only seat 13 8 mins lvl 3.0 Standing bilateral calf raise with eccentric lowering weight shift to Rt leg 3 x 10  Gastroc incline stretch 30 sec x 5  Verbal review of existing HEP and plan for trial HEP going forward over next 2  weeks  TherActivity Tennis ball volley with lateral/back, forward movement 2 sets of approx. 3-4 mins each. With rest break  Neuro Re-ed SLS with blazepod reactive light touching (4 on wall, 2 on floor) 30 sec x 3 each side with 15 sec rest breaks   TODAY'S TREATMENT  DATE:10/12/2023: Therex: Standing heel raises x 15 double leg  Gastroc incline stretch 30 sec x 5  Advice on trial of hitting balls to get response symptoms.    Neuro Re-ed Lateral stepping 3 cones x 8 each , performed bilaterally  SLS with reactive blazepod light touching 3 anteriorly semicircle 30 sec x 4 bilaterally with 15 second rest breaks Tandem stance on foam 1 min x 2 bilateral with occasional HHA, min A by clinic correction for loss of balance    TODAY'S TREATMENT                                                                          DATE:10/05/2023: Therex: HEP review c verbal cues for techniques.   Spent additional time in verbal education about pain monitoring scale with handout with green (0-2/10), yellow (2-4), red (5/10).   Standing double leg PF with Rt leg lowering focus (majority of weight) x 10 (held from HEP) Long sitting. Rt ankle PF blue band x 15/20 with HEP cues Supine bridge c blue band hip abduction hold 2-3 sec x 10 Sidelying Lt hip clam shell blue band x 10 c slow eccentric focus   Manual: Percussive device medial calf trigger points Rt.   PATIENT EDUCATION:  09/30/2023 Education details: HEP, POC Person educated: Patient Education method: Programmer, multimedia, Demonstration, Verbal cues, and Handouts Education comprehension: verbalized understanding, returned demonstration, and verbal cues required  HOME EXERCISE PROGRAM: Access Code: [email or web address] URL: [email or web address] Date: 10/05/2023 Prepared by: Chyrel Masson  Exercises - calf stretch on wall  - 3 x daily - 7 x weekly - 1 sets - 3 reps - 30  seconds hold - standing calf stretch with forefoot on small step or brick  - 3 x daily - 7 x weekly - 1 sets - 3 reps - 30 seconds hold - Standing Bilateral Gastroc Stretch with Step  - 3 x daily - 7 x weekly - 1 sets - 3 reps - 30 seconds hold - Gastroc Stretch on Step  - 3 x daily - 7 x weekly - 1 sets - 3 reps - 30 seconds hold - Long Sitting Ankle Plantar Flexion with Resistance  - 2 x daily - 7 x weekly - 3 sets - 10-15 reps - Supine Bridge with Resistance Band  - 1-2 x daily - 7 x weekly - 2-3 sets - 10-15 reps - 2-3 hold - Clamshell with Resistance  - 1-2 x daily - 7 x weekly - 2-3 sets - 10-15 reps  ASSESSMENT: CLINICAL IMPRESSION: FOTO scoring improved greatly compared to eval presentation.  Performed well in slower speed volley activity within clinic today.  Encouraged introduction of activity on own for pickleball return.   Trial HEP over next 2 weeks.    OBJECTIVE IMPAIRMENTS: Abnormal gait, decreased activity tolerance, decreased balance, decreased knowledge of condition, decreased mobility, decreased ROM, decreased strength, increased edema, increased muscle spasms, and pain.   ACTIVITY LIMITATIONS: carrying, lifting, standing, stairs, and locomotion level  PARTICIPATION LIMITATIONS: community activity and recreational activities  PERSONAL FACTORS: Fitness and 1-2 comorbidities: see PMH  are also affecting patient's functional outcome.   REHAB POTENTIAL: Good  CLINICAL DECISION MAKING: Stable/uncomplicated  EVALUATION COMPLEXITY: Low   GOALS: Goals  reviewed with patient? Yes  SHORT TERM GOALS: (target date for Short term goals 10/08/2023)   1.  Patient will demonstrate independent use of home exercise program to maintain progress from in clinic treatments. Baseline: See objective data Goal status: Met  2. PROM right ankle dorsiflexion with knee ext >10* Baseline: See objective data Goal status:partially met   LONG TERM GOALS: (target dates for all long term  goals  11/12/2023 )   1. Patient will demonstrate/report no pain to facilitate minimal limitation in daily activity secondary to pain symptoms. Baseline: See objective data Goal status: on going 10/19/2023   2. Patient will demonstrate independent use of home exercise program to facilitate ability to maintain/progress functional gains from skilled physical therapy services. Baseline: See objective data Goal status: on going 10/19/2023   3. Patient will demonstrate FOTO outcome > or = 72 % to indicate reduced disability due to condition. Baseline: See objective data Goal status: Met 10/19/2023   4.  Patient will demonstrate right plantarflexion & left hip LE MMT 5/5 throughout to faciltiate usual transfers, stairs, squatting at [facility] for daily life.  Baseline: See objective data Goal status: on going 10/19/2023   5.  Patient ambulates >500' including ability to pick up pace, negotiates ramps & stairs without device independently without pain in right calf.  Baseline: See objective data Goal status: Met 10/19/2023   PLAN:  PT FREQUENCY: 1x/week  PT DURATION: 6 weeks  PLANNED INTERVENTIONS: 97164- PT Re-evaluation, 97110-Therapeutic exercises, 97530- Therapeutic activity, 97112- Neuromuscular re-education, 97535- Self Care, 78295- Manual therapy, [id number]- Gait training, 97014- Electrical stimulation (unattended), [id number]- Electrical stimulation (manual), 97016- Vasopneumatic device, [id number]- Ionotophoresis 4mg /ml Dexamethasone, Patient/Family education, Balance training, Stair training, Taping, Dry Needling, and Joint mobilization  PLAN FOR NEXT SESSION: How was HEP trial and pickle ball activity.    Chyrel Masson, PT, DPT, OCS, ATC 10/19/23  9:19 AM   PHYSICAL THERAPY DISCHARGE SUMMARY  Visits from Start of Care: 4  Current functional level related to goals / functional outcomes: See note   Remaining deficits: See note   Education / Equipment: HEP  Patient goals were  partially met. Patient is being discharged due to not returning since the last visit.  Chyrel Masson, PT, DPT, OCS, ATC 01/05/24  2:06 PM

## 2023-10-26 ENCOUNTER — Encounter: Payer: Medicare Other | Admitting: Rehabilitative and Restorative Service Providers"

## 2023-11-02 ENCOUNTER — Encounter: Payer: Medicare Other | Admitting: Rehabilitative and Restorative Service Providers"

## 2024-06-16 ENCOUNTER — Telehealth (INDEPENDENT_AMBULATORY_CARE_PROVIDER_SITE_OTHER): Payer: Self-pay | Admitting: Otolaryngology

## 2024-06-16 NOTE — Telephone Encounter (Signed)
 LVM to reschedule - Provider not in office

## 2024-07-03 ENCOUNTER — Ambulatory Visit (INDEPENDENT_AMBULATORY_CARE_PROVIDER_SITE_OTHER): Payer: Medicare Other | Admitting: Otolaryngology

## 2024-07-13 ENCOUNTER — Ambulatory Visit: Payer: Medicare Other | Admitting: Neurology

## 2024-07-24 ENCOUNTER — Telehealth (INDEPENDENT_AMBULATORY_CARE_PROVIDER_SITE_OTHER): Payer: Self-pay | Admitting: Otolaryngology

## 2024-07-24 NOTE — Telephone Encounter (Signed)
 LVM to confirm appt & location [phone] afm

## 2024-07-25 ENCOUNTER — Encounter: Payer: Self-pay | Admitting: Internal Medicine

## 2024-07-25 ENCOUNTER — Ambulatory Visit (INDEPENDENT_AMBULATORY_CARE_PROVIDER_SITE_OTHER): Admitting: Otolaryngology

## 2024-07-25 ENCOUNTER — Ambulatory Visit: Attending: Internal Medicine | Admitting: Internal Medicine

## 2024-07-25 VITALS — BP 132/68 | HR 79 | Ht 73.5 in | Wt 227.4 lb

## 2024-07-25 DIAGNOSIS — R011 Cardiac murmur, unspecified: Secondary | ICD-10-CM | POA: Diagnosis not present

## 2024-07-25 NOTE — Progress Notes (Unsigned)
 Cardiology Office Note   Date:  07/25/2024   ID:  Joel Brown, DOB November 30, 1951, MRN [id number]  PCP:  Valentin Skates, DO  Cardiologist:   Vina Gull, MD   PT referred for murmur     History of Present Illness: Joel Brown is a 73 y.o. male with a history of HTN, HL  REferred for heart murmur    He says he had an echo about 1 year ago  Records show it was in 2022 at [facility]       He denies SOB  No CP  No PND  No dizziness   No palpitations   NO PND   Pt is an Technical sales engineer    Not too active        Diet: Breakfast::  Perfect bars   coffee   cream  less  than 1 tsp sugar Snack   Almonds  chips Lunch   May skip   Leftovers  Cutting back on Coke Zeros Dinner:  Vary   [employer] salad  Fried sandwich  Pt says his wife is tired of cooking   Snack  rare cookies       Current Meds  Medication Sig   atorvastatin  (LIPITOR) 10 MG tablet Take 20 mg by mouth daily.   cetirizine  (ZYRTEC ) 10 MG tablet Take 1 tablet (10 mg total) by mouth daily.   COVID-19 mRNA bivalent vaccine, Pfizer, (PFIZER COVID-19 VAC BIVALENT) injection Inject into the muscle.   Glucosamine HCl 1500 MG TABS Take 3,000 mg by mouth daily. Dona   influenza vaccine adjuvanted (FLUAD) 0.5 ML injection Inject into the muscle.   mometasone  (NASONEX ) 50 MCG/ACT nasal spray Place 2 sprays into the nose daily.   Multiple Vitamins-Minerals (MULTIVITAMIN WITH MINERALS) tablet Take 3 tablets by mouth daily. Package with a total of 6 pill   Omega 3 Co q 10/ phyto Probiotic   olmesartan (BENICAR) 40 MG tablet Take 40 mg by mouth daily.   sodium chloride  (MURO 128) 5 % ophthalmic solution 1 drop as needed for eye irritation.   sodium chloride  (OCEAN) 0.65 % SOLN nasal spray Place 1 spray into both nostrils as needed.   Sodium Hyaluronate, oral, (HYALURONIC ACID PO) Take 3 capsules by mouth daily. With supplement     Allergies:   Iodine, Penicillins, Iodinated contrast media, and Latex   Past Medical  History:  Diagnosis Date   Arthritis    fingers, hip   Bilateral corneal abrasions    uses an ointment daily   Cataracts, bilateral    Family history of pancreatic cancer    Family history of prostate cancer    Family history of uterine cancer    Heart murmur    History of shingles    Hyperlipidemia    Kidney disease    OSA (obstructive sleep apnea)    Prediabetes    Sleep apnea     Past Surgical History:  Procedure Laterality Date   EYE SURGERY Bilateral 2010   HERNIA REPAIR     TONSILLECTOMY     as a child   TOTAL HIP ARTHROPLASTY Left 03/14/2021   Procedure: LEFT TOTAL HIP ARTHROPLASTY ANTERIOR APPROACH;  Surgeon: Vernetta Lonni GRADE, MD;  Location: [facility];  Service: Orthopedics;  Laterality: Left;   WISDOM TOOTH EXTRACTION       Social History:  The patient  reports that he has never smoked. He has never used smokeless tobacco. He reports current alcohol use. He reports that he does not use  drugs.   Family History:  The patient's family history includes Bone cancer in his cousin; Brain cancer (age of onset: 27 - 27) in his maternal aunt; Cancer in his cousin, paternal grandmother, and paternal uncle; Heart disease in his father and sister; Heart murmur in his sister; Lung cancer in his maternal uncle; Pancreatic cancer (age of onset: 61) in his brother; Prostate cancer in his maternal uncle; Uterine cancer (age of onset: 76) in his maternal aunt.    ROS:  Please see the history of present illness. All other systems are reviewed and  Negative to the above problem except as noted.    PHYSICAL EXAM: VS:  BP 132/68   Pulse 79   Ht 6' 1.5 (1.867 m)   Wt 227 lb 6.4 oz (103.1 kg)   SpO2 95%   BMI 29.60 kg/m   GEN: Pt in NAD  HEENT: normal  Neck: no JVD, carotid bruit Cardiac: RRR; Gr I/VI systolic murmur at apex   Respiratory:  clear to auscultation  GI: soft, nontender,  No hepatomegaly  Ext  No LE edema  2+ PT pulses   EKG:  EKG shows NSR 79 bpm    Lipid  Panel No results found for: CHOL, TRIG, HDL, CHOLHDL, VLDL, LDLCALC, LDLDIRECT    Wt Readings from Last 3 Encounters:  07/25/24 227 lb 6.4 oz (103.1 kg)  10/08/23 232 lb (105.2 kg)  10/07/23 234 lb (106.1 kg)      ASSESSMENT AND PLAN:  1  Murmur  Very soft systolic murmur at apex, consistent with mild MR  Noted on echo report from 2022    Follow clinically over time  2.  Cardiac risk assessment   Pt would like to have Ca score CT to risk stratify    3  LIpids   LDL 54  HDL 30  Trig 300  Needs to work on diet   Cut out carbs     4  Metabolic   A1C 6  Again, discussed diet   Cut out carbs and processed foods   Perfect bars high in carbs   Follow up TBD     Current medicines are reviewed at length with the patient today.  The patient does not have concerns regarding medicines.  Signed, Vina Gull, MD  07/25/2024 8:48 AM    [facility] [address] [place], [place], [place]  [address] Phone: [phone]; Fax: [phone]

## 2024-07-25 NOTE — Patient Instructions (Signed)
 Medication Instructions:  Your physician recommends that you continue on your current medications as directed. Please refer to the Current Medication list given to you today.  *If you need a refill on your cardiac medications before your next appointment, please call your pharmacy*  Testing/Procedures: Your physician has requested that you have a coronary calcium  score performed. This is not covered by insurance and will be an out-of-pocket cost of approximately $99.  Follow-Up: At [facility], you and your health needs are our priority.  As part of our continuing mission to provide you with exceptional heart care, our providers are all part of one team.  This team includes your primary Cardiologist (physician) and Advanced Practice Providers or APPs (Physician Assistants and Nurse Practitioners) who all work together to provide you with the care you need, when you need it.  Your next appointment:   To be determined after test results.  Provider:   Vina Gull, MD   We recommend signing up for the patient portal called MyChart.  Sign up information is provided on this After Visit Summary.  MyChart is used to connect with patients for Virtual Visits (Telemedicine).  Patients are able to view lab/test results, encounter notes, upcoming appointments, etc.  Non-urgent messages can be sent to your provider as well.    To learn more about what you can do with MyChart, go to [email or web address].

## 2024-07-26 ENCOUNTER — Encounter: Payer: Self-pay | Admitting: Neurology

## 2024-07-26 ENCOUNTER — Ambulatory Visit (INDEPENDENT_AMBULATORY_CARE_PROVIDER_SITE_OTHER): Admitting: Neurology

## 2024-07-26 VITALS — BP 128/79 | HR 60 | Resp 14 | Ht 74.0 in | Wt 226.5 lb

## 2024-07-26 DIAGNOSIS — G4733 Obstructive sleep apnea (adult) (pediatric): Secondary | ICD-10-CM

## 2024-07-26 NOTE — Progress Notes (Signed)
 Patient: Joel Brown Date of Birth: Dec 18, 1950  Reason for Visit: Follow up History from: Patient Primary Neurologist: Dohmeier  ASSESSMENT AND PLAN 73 y.o. year old male   1.  OSA on CPAP (Setup CPAP June 2024)  Very good CPAP compliance.  Will order a mask refit.  Currently using nasal mask with slight leak.  He is prone to corneal abrasions from a previous eye procedure.  His current mask leaks some, contributes to corneal abrasions?  We will try a mask refit consider fullface to fit under the nose to see if less mask leak.  He can reach out to me after using for about 6 weeks and I can check the data.  Otherwise continue nightly usage minimum 4 hours.  Follow-up with me in 1 year.  HISTORY OF PRESENT ILLNESS: Today 07/26/24 Here for CPAP revisit.  90% compliance greater than 4 hours.  10-17 cmH2O.  Leak 26, AHI 2.1. often times may fall asleep around 8 PM, sleeps a few hours on the couch then goes to bed and puts CPAP on. Using nasal mask, slight leak. Cataract surgery 15 years ago, since prone to corneal abrasions. Keeps mask tight to prevent air from blowing, but has a few corneal abrasions yearly, related to CPAP? His mask is leaking some. CPAP remains very beneficial, sleeps better, feels more rested.  ESS 9.  HISTORY  [name] is a 73 y.o. male patient who is here for a revisit on 07/12/2023 for his first revist on CPAP: Mr. Schroader underwent a home sleep test on 5/14 2024 ( watch pat) and he is here to follow-up on his newly issued CPAP for his first compliance visit.   He was referred by Dr. Massie Sewer  He likes his new machine because it has a heated hose and allows higher humidity levels without building up condensation water.  At his last visit his Epworth sleepiness score was endorsed at 10 /24 and his fatigue severity score is 17 / 63 points.   I had ordered a new RESMED autotitration CPAP device with a pressure setting  between a setting of 10 and 17 cm of  water, with 2 cm EPR.     HST : His overall AHI was 30.4/h , his REM AHI 43/h, and his known REM apnea hypopnea index 26/h.  In supine sleep the AHI was more than double as high as in non- supine sleep and rose to 34.5/h from 17/h . There was associated snoring,  mean volume of 41 dB  which was present for about 50% of the total sleep time - but he did not have significant oxygen desaturations.   His compliance looks excellent:  100% of days 97% by time with an average of 5 hours 43 minutes.  His residual apnea hypopnea index is 2.5/h and his 95th percentile pressure  is 12.3 cm of water.  The residual apneas are of obstructive and central origin -in the same proportion.   He does have a higher than average air leak.   Today's fatigue severity score was reduced to 14 out of 63 points and his Epworth Sleepiness Scale was endorsed at 7 out of 24.  So both were slightly reduced in comparison to our last visit.   The geriatric depression score was endorsed at 2 out of 15 points.  REVIEW OF SYSTEMS: Out of a complete 14 system review of symptoms, the patient complains only of the following symptoms, and all other reviewed systems are negative.  See  HPI  ALLERGIES: Allergies  Allergen Reactions   Iodine Itching    IV IODINE    Penicillins Other (See Comments)    unknown  Can't remember reactions   Iodinated Contrast Media Rash   Latex Rash    HOME MEDICATIONS: Outpatient Medications Prior to Visit  Medication Sig Dispense Refill   atorvastatin  (LIPITOR) 10 MG tablet Take 20 mg by mouth daily.     cetirizine  (ZYRTEC ) 10 MG tablet Take 1 tablet (10 mg total) by mouth daily. 30 tablet 11   COVID-19 mRNA bivalent vaccine, Pfizer, (PFIZER COVID-19 VAC BIVALENT) injection Inject into the muscle. 0.3 mL 0   Glucosamine HCl 1500 MG TABS Take 3,000 mg by mouth daily. Dona     influenza vaccine adjuvanted (FLUAD) 0.5 ML injection Inject into the muscle. 0.5 mL 0   mometasone  (NASONEX ) 50  MCG/ACT nasal spray Place 2 sprays into the nose daily. 1 each 12   Multiple Vitamins-Minerals (MULTIVITAMIN WITH MINERALS) tablet Take 3 tablets by mouth daily. Package with a total of 6 pill   Omega 3 Co q 10/ phyto Probiotic     olmesartan (BENICAR) 40 MG tablet Take 40 mg by mouth daily.     sodium chloride  (MURO 128) 5 % ophthalmic solution 1 drop as needed for eye irritation.     sodium chloride  (OCEAN) 0.65 % SOLN nasal spray Place 1 spray into both nostrils as needed. 30 mL 5   Sodium Hyaluronate, oral, (HYALURONIC ACID PO) Take 3 capsules by mouth daily. With supplement     No facility-administered medications prior to visit.    PAST MEDICAL HISTORY: Past Medical History:  Diagnosis Date   Arthritis    fingers, hip   Bilateral corneal abrasions    uses an ointment daily   Cataracts, bilateral    Family history of pancreatic cancer    Family history of prostate cancer    Family history of uterine cancer    Heart murmur    History of shingles    Hyperlipidemia    Kidney disease    OSA (obstructive sleep apnea)    Prediabetes    Sleep apnea     PAST SURGICAL HISTORY: Past Surgical History:  Procedure Laterality Date   EYE SURGERY Bilateral 2010   HERNIA REPAIR     TONSILLECTOMY     as a child   TOTAL HIP ARTHROPLASTY Left 03/14/2021   Procedure: LEFT TOTAL HIP ARTHROPLASTY ANTERIOR APPROACH;  Surgeon: Vernetta Lonni GRADE, MD;  Location: [facility];  Service: Orthopedics;  Laterality: Left;   WISDOM TOOTH EXTRACTION      FAMILY HISTORY: Family History  Problem Relation Age of Onset   Heart disease Father        aortic valve replacement in his 37s   Heart disease Sister        aortic valve dz   Heart murmur Sister    Pancreatic cancer Brother 23   Uterine cancer Maternal Aunt 71   Brain cancer Maternal Aunt 45 - 60   Lung cancer Maternal Uncle        both were smokers   Prostate cancer Maternal Uncle    Cancer Paternal Uncle        NOS   Cancer Paternal  Grandmother    Bone cancer Cousin        maternal first cousin   Cancer Cousin        maternal first cousin with NOS cancer    SOCIAL HISTORY: Social History  Socioeconomic History   Marital status: Married    Spouse name: Candace   Number of children: 1   Years of education: BA   Highest education level: Not on file  Occupational History   Not on file  Tobacco Use   Smoking status: Never   Smokeless tobacco: Never  Vaping Use   Vaping status: Never Used  Substance and Sexual Activity   Alcohol use: Yes    Comment: 1/month   Drug use: Never   Sexual activity: Not on file  Other Topics Concern   Not on file  Social History Narrative   2-3 cups caffeine per day (soda/coffee)   Right handed   Social Drivers of Health   Financial Resource Strain: Not on file  Food Insecurity: Unknown (12/09/2022)   Received from [facility] Insecurity    In the past 3 months, have you had to go without food for 24 hours, multiple times due to lack of resources?: Not on file  Transportation Needs: Unknown (12/09/2022)   Received from [facility] Needs    In the past 3 months, has lack of transporation kept you from medical appointments or getting things you need that are essential to your health?: Not on file  Physical Activity: Not on file  Stress: Not on file  Social Connections: Unknown (01/28/2023)   Received from [facility]   Social Connections    In the past 3 months, do you feel that you lack companionship or social support?: Not on file  Intimate Partner Violence: Not on file   PHYSICAL EXAM  Vitals:   07/26/24 1354  BP: 128/79  Pulse: 60  Resp: 14  SpO2: 96%  Weight: 226 lb 8 oz (102.7 kg)  Height: 6' 2 (1.88 m)   Body mass index is 29.08 kg/m.  Generalized: Well developed, in no acute distress  Neurological examination  Mentation: Alert oriented to time, place, history taking. Follows all commands speech and language  fluent Cranial nerve II-XII: Pupils were equal round reactive to light. Extraocular movements were full, visual field were full on confrontational test. Facial sensation and strength were normal. Head turning and shoulder shrug  were normal and symmetric. Motor: The motor testing reveals 5 over 5 strength of all 4 extremities. Good symmetric motor tone is noted throughout.  Gait and station: Gait is normal.  DIAGNOSTIC DATA (LABS, IMAGING, TESTING) - I reviewed patient records, labs, notes, testing and imaging myself where available.  Lab Results  Component Value Date   WBC 12.3 (H) 03/15/2021   HGB 14.2 03/15/2021   HCT 40.9 03/15/2021   MCV 92.1 03/15/2021   PLT 202 03/15/2021      Component Value Date/Time   NA 134 (L) 03/15/2021 0256   K 4.5 03/15/2021 0256   CL 104 03/15/2021 0256   CO2 24 03/15/2021 0256   GLUCOSE 152 (H) 03/15/2021 0256   BUN 20 03/15/2021 0256   CREATININE 1.30 (H) 03/15/2021 0256   CALCIUM  8.3 (L) 03/15/2021 0256   GFRNONAA >60 03/15/2021 0256   No results found for: CHOL, HDL, LDLCALC, LDLDIRECT, TRIG, CHOLHDL No results found for: YHAJ8R No results found for: VITAMINB12 No results found for: TSH  Lauraine Born, AGNP-C, DNP 07/26/2024, 2:16 PM [facility] [address], [address] [place], [place] [address] [phone]

## 2024-07-26 NOTE — Patient Instructions (Signed)
 Mask refit for leak  Great to see you today! Continue CPAP usage minimum 4 hours nightly Continue current settings Continue to replace supplies routinely through DME Follow-up in 1 year or sooner if needed. Thanks!!

## 2024-07-27 ENCOUNTER — Telehealth: Payer: Self-pay

## 2024-07-27 NOTE — Telephone Encounter (Signed)
Community message sent via epic for cpap orders

## 2024-07-31 ENCOUNTER — Other Ambulatory Visit (HOSPITAL_COMMUNITY)

## 2024-08-01 ENCOUNTER — Ambulatory Visit (HOSPITAL_COMMUNITY)
Admission: RE | Admit: 2024-08-01 | Discharge: 2024-08-01 | Disposition: A | Discharge status: Home / Self Care | Payer: Self-pay | Source: Ambulatory Visit | Attending: Cardiovascular Disease | Admitting: Cardiovascular Disease

## 2024-08-01 ENCOUNTER — Ambulatory Visit (INDEPENDENT_AMBULATORY_CARE_PROVIDER_SITE_OTHER): Admitting: Otolaryngology

## 2024-08-01 DIAGNOSIS — I2510 Atherosclerotic heart disease of native coronary artery without angina pectoris: Secondary | ICD-10-CM | POA: Insufficient documentation

## 2024-08-01 DIAGNOSIS — R011 Cardiac murmur, unspecified: Secondary | ICD-10-CM | POA: Insufficient documentation

## 2024-08-03 ENCOUNTER — Telehealth: Payer: Self-pay

## 2024-08-03 ENCOUNTER — Ambulatory Visit: Payer: Self-pay | Admitting: Internal Medicine

## 2024-08-03 DIAGNOSIS — R931 Abnormal findings on diagnostic imaging of heart and coronary circulation: Secondary | ICD-10-CM

## 2024-08-03 DIAGNOSIS — Z79899 Other long term (current) drug therapy: Secondary | ICD-10-CM

## 2024-08-03 MED ORDER — ASPIRIN 81 MG PO TBEC: 81.0000 mg | DELAYED_RELEASE_TABLET | Freq: Every day | ORAL | Status: AC

## 2024-08-03 NOTE — Telephone Encounter (Signed)
 Letter of medical necessity faxed to [facility] by haleigh f, cma

## 2025-05-08 ENCOUNTER — Ambulatory Visit: Admitting: Internal Medicine

## 2025-07-25 ENCOUNTER — Ambulatory Visit: Admitting: Neurology
